# Patient Record
Sex: Male | Born: 1990
Health system: Southern US, Community
[De-identification: ages and names within clinical notes are randomized; demographics above are authoritative.]

## PROBLEM LIST (undated history)

## (undated) DIAGNOSIS — Z789 Other specified health status: Secondary | ICD-10-CM

## (undated) HISTORY — PX: FRACTURE SURGERY: SHX138

## (undated) HISTORY — PX: WISDOM TOOTH EXTRACTION: SHX21

---
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
## 2015-02-23 ENCOUNTER — Encounter: Payer: Self-pay | Admitting: Podiatry

## 2015-02-23 ENCOUNTER — Ambulatory Visit (INDEPENDENT_AMBULATORY_CARE_PROVIDER_SITE_OTHER): Payer: BLUE CROSS/BLUE SHIELD | Admitting: Podiatry

## 2015-02-23 VITALS — BP 122/83 | HR 89 | Resp 18

## 2015-02-23 DIAGNOSIS — B351 Tinea unguium: Secondary | ICD-10-CM

## 2015-02-23 DIAGNOSIS — L600 Ingrowing nail: Secondary | ICD-10-CM
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
## 2015-02-23 NOTE — Progress Notes (Signed)
Subjective:     Patient ID: Dylan Rose, male   DOB: 11/01/1990, 24 y.o.   MRN: [id number]  HPI patient presents with a damaged right hallux nail and is concerned about whether it should be removed and whether there is any issues associated with   Review of Systems  All other systems reviewed and are negative.      Objective:   Physical Exam  Constitutional: Dylan Rose is oriented to person, place, and time.  Cardiovascular: Intact distal pulses.   Musculoskeletal: Normal range of motion.  Neurological: Dylan Rose is oriented to person, place, and time.  Skin: Skin is warm.  Nursing note and vitals reviewed.  neurovascular status intact muscle strength adequate with range of motion within normal limits. Patient's right hallux nail is dystrophic and dark in color but it is localized and slightly loose at the end. States it's been this way for several years and has had a history of trauma     Assessment:     Damage right hallux nail that is essentially dead and is no longer growing properly    Plan:     H&P reviewed and condition explained to patient. At this time we will hold off on permanent removal but at one point in future we'll probably be necessary and I educated him on this event

## 2017-06-03 ENCOUNTER — Ambulatory Visit (INDEPENDENT_AMBULATORY_CARE_PROVIDER_SITE_OTHER): Payer: 59 | Admitting: Family Medicine

## 2017-06-03 ENCOUNTER — Encounter: Payer: Self-pay | Admitting: Family Medicine

## 2017-06-03 VITALS — BP 132/84 | HR 73 | Temp 98.2°F | Resp 18 | Ht 75.0 in | Wt 393.4 lb

## 2017-06-03 DIAGNOSIS — H60391 Other infective otitis externa, right ear: Secondary | ICD-10-CM

## 2017-06-03 DIAGNOSIS — J3089 Other allergic rhinitis: Secondary | ICD-10-CM

## 2017-06-03 MED ORDER — FLUTICASONE PROPIONATE 50 MCG/ACT NA SUSP: 2.0000 | Freq: Every day | NASAL | 6 refills | Status: DC

## 2017-06-03 MED ORDER — CETIRIZINE HCL 10 MG PO TABS: 10.0000 mg | ORAL_TABLET | Freq: Every day | ORAL | 11 refills | Status: DC

## 2017-06-03 NOTE — Progress Notes (Signed)
  Chief Complaint  Patient presents with  . Ear Pain    right ear x 2 weeks , saw an NP dx with infection and given amox for 10 days- on last day today, intermittent pain still    HPI  This is a new patient to our practice with no PMH present today after seeing a NP and being treated for otitis externa right ear with augmentin. He is on day 9/10 and denies any fevers or chills He has some nasal congestion and pressure in the ear [place] [facility] [place] and wanted to ensure he was doing well enough to travel.  He denies sick contacts or asthma. He does not want to the flu vaccine.   History reviewed. No pertinent past medical history.  Current Outpatient Prescriptions  Medication Sig Dispense Refill  . cetirizine (ZYRTEC) 10 MG tablet Take 1 tablet (10 mg total) by mouth daily. 30 tablet 11  . fluticasone (FLONASE) 50 MCG/ACT nasal spray Place 2 sprays into both nostrils daily. 16 g 6   No current facility-administered medications for this visit.     Allergies: Not on File  Past Surgical History:  Procedure Laterality Date  . FRACTURE SURGERY      Social History   Social History  . Marital status: Married    Spouse name: N/A  . Number of children: N/A  . Years of education: N/A   Social History Main Topics  . Smoking status: Never Smoker  . Smokeless tobacco: Never Used  . Alcohol use No  . Drug use: No  . Sexual activity: Not Asked   Other Topics Concern  . None   Social History Narrative  . None    ROS Review of Systems See HPI Constitution: No fevers or chills No malaise No diaphoresis Skin: No rash or itching Eyes: no blurry vision, no double vision GU: no dysuria or hematuria Neuro: no dizziness or headaches  Objective: Vitals:   06/03/17 0838  BP: 132/84  Pulse: 73  Resp: 18  Temp: 98.2 F (36.8 C)  TempSrc: Oral  SpO2: 99%  Weight: (!) 393 lb 6.4 oz (178.4 kg)  Height: 6\' 3"  (1.905 m)    Physical Exam General: alert,  oriented, in NAD Head: normocephalic, atraumatic, no sinus tenderness Eyes: EOM intact, no scleral icterus or conjunctival injection Ears: TM clear bilaterally, bulging with fluid Nose: mucosa nonerythematous, nonedematous Throat: no pharyngeal exudate or erythema Lymph: no posterior auricular, submental or cervical lymph adenopathy Heart: normal rate, normal sinus rhythm, no murmurs Lungs: clear to auscultation bilaterally, no wheezing   Assessment and Plan Leonette MostCharles was seen today for ear pain.  Diagnoses and all orders for this visit:  Infective otitis externa of right ear- supportive care advised  Non-seasonal allergic rhinitis due to fungal spores -     fluticasone (FLONASE) 50 MCG/ACT nasal spray; Place 2 sprays into both nostrils daily. -     cetirizine (ZYRTEC) 10 MG tablet; Take 1 tablet (10 mg total) by mouth daily.     Ulyana Pitones A Jayson Waterhouse

## 2017-06-03 NOTE — Patient Instructions (Addendum)
   IF you received an x-ray today, you will receive an invoice from [facility]. Please contact [facility] at [phone] with questions or concerns regarding your invoice.   IF you received labwork today, you will receive an invoice from [facility]. Please contact [facility] at [phone] with questions or concerns regarding your invoice.   Our billing staff will not be able to assist you with questions regarding bills from these companies.  You will be contacted with the lab results as soon as they are available. The fastest way to get your results is to activate your My Chart account. Instructions are located on the last page of this paperwork. If you have not heard from us regarding the results in 2 weeks, please contact this office.     Allergic Rhinitis Allergic rhinitis is when the mucous membranes in the nose respond to allergens. Allergens are particles in the air that cause your body to have an allergic reaction. This causes you to release allergic antibodies. Through a chain of events, these eventually cause you to release histamine into the blood stream. Although meant to protect the body, it is this release of histamine that causes your discomfort, such as frequent sneezing, congestion, and an itchy, runny nose. What are the causes? Seasonal allergic rhinitis (hay fever) is caused by pollen allergens that may come from grasses, trees, and weeds. Year-round allergic rhinitis (perennial allergic rhinitis) is caused by allergens such as house dust mites, pet dander, and mold spores. What are the signs or symptoms?  Nasal stuffiness (congestion).  Itchy, runny nose with sneezing and tearing of the eyes. How is this diagnosed? Your health care provider can help you determine the allergen or allergens that trigger your symptoms. If you and your health care provider are unable to determine the allergen, skin or blood testing may be used. Your health care provider will  diagnose your condition after taking your health history and performing a physical exam. Your health care provider may assess you for other related conditions, such as asthma, pink eye, or an ear infection. How is this treated? Allergic rhinitis does not have a cure, but it can be controlled by:  Medicines that block allergy symptoms. These may include allergy shots, nasal sprays, and oral antihistamines.  Avoiding the allergen. Hay fever may often be treated with antihistamines in pill or nasal spray forms. Antihistamines block the effects of histamine. There are over-the-counter medicines that may help with nasal congestion and swelling around the eyes. Check with your health care provider before taking or giving this medicine. If avoiding the allergen or the medicine prescribed do not work, there are many new medicines your health care provider can prescribe. Stronger medicine may be used if initial measures are ineffective. Desensitizing injections can be used if medicine and avoidance does not work. Desensitization is when a patient is given ongoing shots until the body becomes less sensitive to the allergen. Make sure you follow up with your health care provider if problems continue. Follow these instructions at home: It is not possible to completely avoid allergens, but you can reduce your symptoms by taking steps to limit your exposure to them. It helps to know exactly what you are allergic to so that you can avoid your specific triggers. Contact a health care provider if:  You have a fever.  You develop a cough that does not stop easily (persistent).  You have shortness of breath.  You start wheezing.  Symptoms interfere with normal daily activities.   This information is not intended to replace advice given to you by your health care provider. Make sure you discuss any questions you have with your health care provider. Document Released: 06/03/2001 Document Revised: 05/09/2016 Document  Reviewed: 05/16/2013 Elsevier Interactive Patient Education  2017 Elsevier Inc.  

## 2017-06-24 ENCOUNTER — Ambulatory Visit (INDEPENDENT_AMBULATORY_CARE_PROVIDER_SITE_OTHER): Payer: 59

## 2017-06-24 ENCOUNTER — Encounter (HOSPITAL_COMMUNITY): Payer: Self-pay

## 2017-06-24 ENCOUNTER — Encounter (HOSPITAL_COMMUNITY): Payer: Self-pay | Admitting: Emergency Medicine

## 2017-06-24 ENCOUNTER — Emergency Department (HOSPITAL_COMMUNITY)
Admission: EM | Admit: 2017-06-24 | Discharge: 2017-06-24 | Disposition: A | Discharge status: Home / Self Care | Payer: 59 | Attending: Emergency Medicine | Admitting: Emergency Medicine

## 2017-06-24 ENCOUNTER — Ambulatory Visit (HOSPITAL_COMMUNITY)
Admit: 2017-06-24 | Discharge: 2017-06-24 | Disposition: A | Discharge status: Home / Self Care | Payer: 59 | Attending: Family Medicine | Admitting: Family Medicine

## 2017-06-24 ENCOUNTER — Emergency Department (HOSPITAL_COMMUNITY): Payer: 59

## 2017-06-24 ENCOUNTER — Ambulatory Visit (HOSPITAL_COMMUNITY)
Admission: EM | Admit: 2017-06-24 | Discharge: 2017-06-24 | Disposition: A | Discharge status: Home / Self Care | Payer: 59 | Attending: Family Medicine | Admitting: Family Medicine

## 2017-06-24 DIAGNOSIS — S4992XA Unspecified injury of left shoulder and upper arm, initial encounter: Secondary | ICD-10-CM

## 2017-06-24 DIAGNOSIS — Z01818 Encounter for other preprocedural examination: Secondary | ICD-10-CM | POA: Diagnosis not present

## 2017-06-24 DIAGNOSIS — S42292A Other displaced fracture of upper end of left humerus, initial encounter for closed fracture: Secondary | ICD-10-CM | POA: Diagnosis not present

## 2017-06-24 DIAGNOSIS — Y999 Unspecified external cause status: Secondary | ICD-10-CM | POA: Insufficient documentation

## 2017-06-24 DIAGNOSIS — Z79899 Other long term (current) drug therapy: Secondary | ICD-10-CM | POA: Diagnosis not present

## 2017-06-24 DIAGNOSIS — M79602 Pain in left arm: Secondary | ICD-10-CM

## 2017-06-24 DIAGNOSIS — M25512 Pain in left shoulder: Secondary | ICD-10-CM

## 2017-06-24 DIAGNOSIS — Y929 Unspecified place or not applicable: Secondary | ICD-10-CM | POA: Diagnosis not present

## 2017-06-24 DIAGNOSIS — M79622 Pain in left upper arm: Secondary | ICD-10-CM

## 2017-06-24 DIAGNOSIS — S42295A Other nondisplaced fracture of upper end of left humerus, initial encounter for closed fracture: Secondary | ICD-10-CM | POA: Insufficient documentation

## 2017-06-24 DIAGNOSIS — M19012 Primary osteoarthritis, left shoulder: Secondary | ICD-10-CM | POA: Diagnosis not present

## 2017-06-24 DIAGNOSIS — Y9389 Activity, other specified: Secondary | ICD-10-CM | POA: Insufficient documentation

## 2017-06-24 MED ORDER — METHOCARBAMOL 500 MG PO TABS: 500.0000 mg | ORAL_TABLET | Freq: Three times a day (TID) | ORAL | 0 refills | Status: DC | PRN

## 2017-06-24 MED ORDER — MORPHINE SULFATE (PF) 4 MG/ML IV SOLN
INTRAVENOUS | Status: AC
  Filled 2017-06-24: qty 1

## 2017-06-24 MED ORDER — HYDROMORPHONE HCL 1 MG/ML IJ SOLN
INTRAMUSCULAR | Status: AC
  Filled 2017-06-24: qty 2

## 2017-06-24 MED ORDER — OXYCODONE-ACETAMINOPHEN 5-325 MG PO TABS
1.0000 | ORAL_TABLET | Freq: Once | ORAL | Status: AC
  Administered 2017-06-24: 1 via ORAL
  Filled 2017-06-24: qty 1

## 2017-06-24 MED ORDER — HYDROMORPHONE HCL 1 MG/ML IJ SOLN
2.0000 mg | Freq: Once | INTRAMUSCULAR | Status: AC
  Administered 2017-06-24: 2 mg via INTRAMUSCULAR
  Filled 2017-06-24: qty 2

## 2017-06-24 MED ORDER — NAPROXEN 500 MG PO TABS: 500.0000 mg | ORAL_TABLET | Freq: Two times a day (BID) | ORAL | 0 refills | Status: DC | PRN

## 2017-06-24 MED ORDER — MORPHINE SULFATE (PF) 2 MG/ML IV SOLN
4.0000 mg | Freq: Once | INTRAVENOUS | Status: AC
  Administered 2017-06-24: 4 mg via INTRAMUSCULAR

## 2017-06-24 MED ORDER — ONDANSETRON 4 MG PO TBDP
ORAL_TABLET | ORAL | Status: AC
  Filled 2017-06-24: qty 1

## 2017-06-24 MED ORDER — METHOCARBAMOL 500 MG PO TABS
500.0000 mg | ORAL_TABLET | Freq: Once | ORAL | Status: AC
  Administered 2017-06-24: 500 mg via ORAL
  Filled 2017-06-24: qty 1

## 2017-06-24 MED ORDER — HYDROMORPHONE HCL 1 MG/ML IJ SOLN
INTRAMUSCULAR | Status: AC
  Filled 2017-06-24: qty 1

## 2017-06-24 MED ORDER — IBUPROFEN 800 MG PO TABS
ORAL_TABLET | ORAL | Status: AC
  Filled 2017-06-24: qty 1

## 2017-06-24 MED ORDER — KETOROLAC TROMETHAMINE 30 MG/ML IJ SOLN
30.0000 mg | Freq: Once | INTRAMUSCULAR | Status: AC
  Administered 2017-06-24: 30 mg via INTRAMUSCULAR
  Filled 2017-06-24: qty 1

## 2017-06-24 MED ORDER — IBUPROFEN 800 MG PO TABS
800.0000 mg | ORAL_TABLET | Freq: Once | ORAL | Status: AC
  Administered 2017-06-24: 800 mg via ORAL

## 2017-06-24 MED ORDER — HYDROMORPHONE HCL 1 MG/ML IJ SOLN
1.0000 mg | Freq: Once | INTRAMUSCULAR | Status: AC
  Administered 2017-06-24: 1 mg via INTRAMUSCULAR

## 2017-06-24 MED ORDER — ONDANSETRON 4 MG PO TBDP
4.0000 mg | ORAL_TABLET | Freq: Once | ORAL | Status: AC
  Administered 2017-06-24: 4 mg via ORAL

## 2017-06-24 MED ORDER — HYDROMORPHONE HCL 1 MG/ML IJ SOLN
2.0000 mg | Freq: Once | INTRAMUSCULAR | Status: AC
  Administered 2017-06-24: 2 mg via INTRAMUSCULAR

## 2017-06-24 MED ORDER — OXYCODONE-ACETAMINOPHEN 10-325 MG PO TABS: ORAL_TABLET | ORAL | 0 refills | Status: DC
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
## 2017-06-24 NOTE — Discharge Instructions (Signed)
Wear splint and shoulder sling at all times until you see the orthopedist.  Ice and elevate arm/shoulder throughout the day, using ice pack for no more than 20 minutes every hour.  Alternate between naprosyn and percocet for pain relief, and use robaxin as directed as needed for muscle spasms. Do not drive or operate machinery with pain medication or muscle relaxant use. Follow up with the orthopedist in the next 3-5 days for recheck of symptoms and ongoing management of your arm fracture. Return to the ER for changes or worsening symptoms.

## 2017-06-24 NOTE — ED Notes (Signed)
Provider wants the x-rays performed 15-20 minutes after patient has received medication

## 2017-06-24 NOTE — ED Notes (Signed)
ED Provider at bedside. 

## 2017-06-24 NOTE — ED Notes (Signed)
PT ao x 4.  RR 18.  No respiratory distress noted.
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
## 2017-06-24 NOTE — Progress Notes (Signed)
Orthopedic Tech Progress Note Patient Details:  Dylan Rose 11-Sep-1991 [id number]  Ortho Devices Type of Ortho Device: Ace wrap, Coapt Ortho Device/Splint Location: LUE Ortho Device/Splint Interventions: Ordered, Application   Jennye Moccasin 06/24/2017, 6:42 PM
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
## 2017-06-24 NOTE — ED Notes (Signed)
Pt transported to CT ?

## 2017-06-26 NOTE — Pre-Procedure Instructions (Signed)
Dylan Rose  06/26/2017      [id number] - [place], [place] - [address] AT CORNER OF [facility] [address] [place] [address] Phone: [phone] Fax: [phone]    Your procedure is scheduled on June 30, 2017.  Report to [facility] Admitting at 1030 AM.  Call this number if you have problems the morning of surgery:  [phone]   Remember:  Do not eat food or drink liquids after midnight.  Take these medicines the morning of surgery with A SIP OF WATER cetirizine (zyrtec), diazepam (valium)-if needed, benadryl-if needed, fluticasone (flonase)-if needed, hydromorphone (dilaudid)-if needed for pain, oxycodone-acetaminophen (percocet)-if needed for pain, methocarbamol (robaxin)-if needed for muscle spasms.     7 days prior to surgery STOP taking any Aspirin (unless otherwise instructed by your surgeon), Aleve, Naproxen, Ibuprofen, Motrin, Advil, Goody's, BC's, all herbal medications, fish oil, and all vitamins  Continue all other medications as instructed by your physician except follow the above medication instructions before surgery   Do not wear jewelry, make-up or nail polish.  Do not wear lotions, powders, or perfumes, or deoderant.  Men may shave face and neck.  Do not bring valuables to the hospital.  [facility] is not responsible for any belongings or valuables.  Contacts, dentures or bridgework may not be worn into surgery.  Leave your suitcase in the car.  After surgery it may be brought to your room.  For patients admitted to the hospital, discharge time will be determined by your treatment team.  Patients discharged the day of surgery will not be allowed to drive home.   Special instructions:   [place]- Preparing For Surgery  Before surgery, you can play an important role. Because skin is not sterile, your skin needs to be as free of germs as possible. You can reduce the number of germs on  your skin by washing with CHG (chlorahexidine gluconate) Soap before surgery.  CHG is an antiseptic cleaner which kills germs and bonds with the skin to continue killing germs even after washing.  Please do not use if you have an allergy to CHG or antibacterial soaps. If your skin becomes reddened/irritated stop using the CHG.  Do not shave (including legs and underarms) for at least 48 hours prior to first CHG shower. It is OK to shave your face.  Please follow these instructions carefully.   1. Shower the NIGHT BEFORE SURGERY and the MORNING OF SURGERY with CHG.   2. If you chose to wash your hair, wash your hair first as usual with your normal shampoo.  3. After you shampoo, rinse your hair and body thoroughly to remove the shampoo.  4. Use CHG as you would any other liquid soap. You can apply CHG directly to the skin and wash gently with a scrungie or a clean washcloth.   5. Apply the CHG Soap to your body ONLY FROM THE NECK DOWN.  Do not use on open wounds or open sores. Avoid contact with your eyes, ears, mouth and genitals (private parts). Wash genitals (private parts) with your normal soap.  USE REGULAR SHAMPOO AND CONDITIONER FOR HAIR USE REGULAR SOAP FOR FACE AND PRIVATE AREA  6. Wash thoroughly, paying special attention to the area where your surgery will be performed.  7. Thoroughly rinse your body with warm water from the neck down.  8. DO NOT shower/wash with your normal soap after using and rinsing off the CHG Soap.  9. Pat yourself dry with a CLEAN TOWEL and WASH CLOTH  10. Wear CLEAN PAJAMAS to bed the night before surgery, wear comfortable clothes the morning of surgery  11. Place CLEAN SHEETS on your bed the night of your first shower and DO NOT SLEEP WITH PETS.    Day of Surgery: Do not apply any deodorants/lotions. Please wear clean clothes to the hospital/surgery center.    Please read over the following fact sheets that you were given. Pain Booklet,  Coughing and Deep Breathing and Surgical Site Infection Prevention
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
## 2017-06-29 ENCOUNTER — Encounter (HOSPITAL_COMMUNITY)
Admission: RE | Admit: 2017-06-29 | Discharge: 2017-06-29 | Disposition: A | Discharge status: Home / Self Care | Payer: 59 | Source: Ambulatory Visit | Attending: Orthopedic Surgery | Admitting: Orthopedic Surgery

## 2017-06-29 ENCOUNTER — Encounter (HOSPITAL_COMMUNITY): Payer: Self-pay

## 2017-06-29 DIAGNOSIS — Y92488 Other paved roadways as the place of occurrence of the external cause: Secondary | ICD-10-CM | POA: Diagnosis not present

## 2017-06-29 DIAGNOSIS — Z79891 Long term (current) use of opiate analgesic: Secondary | ICD-10-CM | POA: Diagnosis not present

## 2017-06-29 DIAGNOSIS — Z79899 Other long term (current) drug therapy: Secondary | ICD-10-CM | POA: Diagnosis not present

## 2017-06-29 DIAGNOSIS — S42202A Unspecified fracture of upper end of left humerus, initial encounter for closed fracture: Secondary | ICD-10-CM | POA: Diagnosis present

## 2017-06-29 HISTORY — DX: Other specified health status: Z78.9

## 2017-06-29 LAB — CBC
HEMATOCRIT: 40.7 % (ref 39.0–52.0)
HEMOGLOBIN: 15 g/dL (ref 13.0–17.0)
MCH: 30.5 pg (ref 26.0–34.0)
MCHC: 36.9 g/dL — ABNORMAL HIGH (ref 30.0–36.0)
MCV: 82.9 fL (ref 78.0–100.0)
Platelets: 298 10*3/uL (ref 150–400)
RBC: 4.91 MIL/uL (ref 4.22–5.81)
RDW: 12.4 % (ref 11.5–15.5)
WBC: 8.1 10*3/uL (ref 4.0–10.5)

## 2017-06-29 LAB — BASIC METABOLIC PANEL
ANION GAP: 12 (ref 5–15)
BUN: 9 mg/dL (ref 6–20)
CHLORIDE: 98 mmol/L — AB (ref 101–111)
CO2: 24 mmol/L (ref 22–32)
Calcium: 8.7 mg/dL — ABNORMAL LOW (ref 8.9–10.3)
Creatinine, Ser: 0.73 mg/dL (ref 0.61–1.24)
GFR calc Af Amer: >60 mL/min (ref >60)
GLUCOSE: 114 mg/dL — AB (ref 65–99)
POTASSIUM: 4.2 mmol/L (ref 3.5–5.1)
Sodium: 134 mmol/L — ABNORMAL LOW (ref 135–145)

## 2017-06-29 MED ORDER — DEXTROSE 5 % IV SOLN
3.0000 g | INTRAVENOUS | Status: AC
  Administered 2017-06-30: 3 g via INTRAVENOUS
  Filled 2017-06-29: qty 3000

## 2017-06-30 ENCOUNTER — Encounter (HOSPITAL_COMMUNITY): Payer: Self-pay

## 2017-06-30 ENCOUNTER — Ambulatory Visit (HOSPITAL_COMMUNITY)
Admission: RE | Admit: 2017-06-30 | Discharge: 2017-07-01 | Disposition: A | Discharge status: Home / Self Care | Payer: 59 | Source: Ambulatory Visit | Attending: Orthopedic Surgery | Admitting: Orthopedic Surgery

## 2017-06-30 ENCOUNTER — Ambulatory Visit (HOSPITAL_COMMUNITY): Payer: 59 | Admitting: Anesthesiology

## 2017-06-30 ENCOUNTER — Encounter (HOSPITAL_COMMUNITY)
Admission: RE | Disposition: A | Discharge status: Home / Self Care | Payer: Self-pay | Source: Ambulatory Visit | Attending: Orthopedic Surgery

## 2017-06-30 ENCOUNTER — Ambulatory Visit (HOSPITAL_COMMUNITY): Payer: 59

## 2017-06-30 DIAGNOSIS — Z79899 Other long term (current) drug therapy: Secondary | ICD-10-CM | POA: Insufficient documentation

## 2017-06-30 DIAGNOSIS — S42292A Other displaced fracture of upper end of left humerus, initial encounter for closed fracture: Secondary | ICD-10-CM | POA: Diagnosis present

## 2017-06-30 DIAGNOSIS — Z419 Encounter for procedure for purposes other than remedying health state, unspecified: Secondary | ICD-10-CM

## 2017-06-30 DIAGNOSIS — S42202A Unspecified fracture of upper end of left humerus, initial encounter for closed fracture: Secondary | ICD-10-CM | POA: Diagnosis present

## 2017-06-30 DIAGNOSIS — Z79891 Long term (current) use of opiate analgesic: Secondary | ICD-10-CM | POA: Insufficient documentation

## 2017-06-30 DIAGNOSIS — Y92488 Other paved roadways as the place of occurrence of the external cause: Secondary | ICD-10-CM | POA: Insufficient documentation

## 2017-06-30 HISTORY — PX: ORIF HUMERUS FRACTURE: SHX2126

## 2017-06-30 SURGERY — OPEN REDUCTION INTERNAL FIXATION (ORIF) PROXIMAL HUMERUS FRACTURE
Anesthesia: General | Site: Arm Upper | Laterality: Left

## 2017-06-30 MED ORDER — FENTANYL CITRATE (PF) 100 MCG/2ML IJ SOLN
INTRAMUSCULAR | Status: AC
  Administered 2017-06-30: 100 ug via INTRAVENOUS
  Filled 2017-06-30: qty 2

## 2017-06-30 MED ORDER — SUCCINYLCHOLINE CHLORIDE 200 MG/10ML IV SOSY
PREFILLED_SYRINGE | INTRAVENOUS | Status: AC
  Filled 2017-06-30: qty 10

## 2017-06-30 MED ORDER — LIDOCAINE 2% (20 MG/ML) 5 ML SYRINGE
INTRAMUSCULAR | Status: AC
  Filled 2017-06-30: qty 5

## 2017-06-30 MED ORDER — PROPOFOL 10 MG/ML IV BOLUS
INTRAVENOUS | Status: AC
  Filled 2017-06-30: qty 20

## 2017-06-30 MED ORDER — LACTATED RINGERS IV SOLN
INTRAVENOUS | Status: DC
  Administered 2017-06-30: 13:00:00 via INTRAVENOUS

## 2017-06-30 MED ORDER — CHLORHEXIDINE GLUCONATE 4 % EX LIQD: 60.0000 mL | Freq: Once | CUTANEOUS | Status: DC

## 2017-06-30 MED ORDER — HYDROMORPHONE HCL 2 MG PO TABS
2.0000 mg | ORAL_TABLET | ORAL | Status: DC | PRN
  Administered 2017-07-01 (×2): 4 mg via ORAL
  Filled 2017-06-30 (×2): qty 2

## 2017-06-30 MED ORDER — FLUTICASONE PROPIONATE 50 MCG/ACT NA SUSP
1.0000 | Freq: Every day | NASAL | Status: DC | PRN
  Filled 2017-06-30: qty 16

## 2017-06-30 MED ORDER — PROPOFOL 10 MG/ML IV BOLUS
INTRAVENOUS | Status: DC | PRN
Start: 2017-06-30 — End: 2017-06-30
  Administered 2017-06-30: 300 mg via INTRAVENOUS

## 2017-06-30 MED ORDER — MEPERIDINE HCL 25 MG/ML IJ SOLN: 6.2500 mg | INTRAMUSCULAR | Status: DC | PRN

## 2017-06-30 MED ORDER — DIAZEPAM 5 MG PO TABS
10.0000 mg | ORAL_TABLET | Freq: Three times a day (TID) | ORAL | Status: DC | PRN
  Administered 2017-07-01 (×2): 10 mg via ORAL
  Filled 2017-06-30 (×2): qty 2

## 2017-06-30 MED ORDER — ACETAMINOPHEN 650 MG RE SUPP: 650.0000 mg | Freq: Four times a day (QID) | RECTAL | Status: DC | PRN

## 2017-06-30 MED ORDER — DEXMEDETOMIDINE HCL 200 MCG/2ML IV SOLN
INTRAVENOUS | Status: DC | PRN
  Administered 2017-06-30 (×6): 12 ug via INTRAVENOUS

## 2017-06-30 MED ORDER — ONDANSETRON HCL 4 MG PO TABS: 4.0000 mg | ORAL_TABLET | Freq: Four times a day (QID) | ORAL | Status: DC | PRN

## 2017-06-30 MED ORDER — FENTANYL CITRATE (PF) 100 MCG/2ML IJ SOLN: 25.0000 ug | INTRAMUSCULAR | Status: DC | PRN

## 2017-06-30 MED ORDER — FENTANYL CITRATE (PF) 100 MCG/2ML IJ SOLN
100.0000 ug | Freq: Once | INTRAMUSCULAR | Status: AC
  Administered 2017-06-30: 100 ug via INTRAVENOUS
  Filled 2017-06-30: qty 2

## 2017-06-30 MED ORDER — ROPIVACAINE HCL 7.5 MG/ML IJ SOLN
INTRAMUSCULAR | Status: DC | PRN
  Administered 2017-06-30: 30 mL via PERINEURAL

## 2017-06-30 MED ORDER — FENTANYL CITRATE (PF) 100 MCG/2ML IJ SOLN
100.0000 ug | Freq: Once | INTRAMUSCULAR | Status: AC
  Administered 2017-06-30: 100 ug via INTRAVENOUS

## 2017-06-30 MED ORDER — MIDAZOLAM HCL 2 MG/2ML IJ SOLN
2.0000 mg | Freq: Once | INTRAMUSCULAR | Status: AC
Start: 2017-06-30 — End: 2017-06-30
  Administered 2017-06-30: 2 mg via INTRAVENOUS

## 2017-06-30 MED ORDER — SUCCINYLCHOLINE CHLORIDE 20 MG/ML IJ SOLN
INTRAMUSCULAR | Status: DC | PRN
  Administered 2017-06-30: 200 mg via INTRAVENOUS

## 2017-06-30 MED ORDER — DEXAMETHASONE SODIUM PHOSPHATE 10 MG/ML IJ SOLN
INTRAMUSCULAR | Status: DC | PRN
  Administered 2017-06-30: 10 mg via INTRAVENOUS

## 2017-06-30 MED ORDER — ACETAMINOPHEN 325 MG PO TABS: 650.0000 mg | ORAL_TABLET | Freq: Four times a day (QID) | ORAL | Status: DC | PRN

## 2017-06-30 MED ORDER — HYDROMORPHONE HCL 1 MG/ML IJ SOLN
1.0000 mg | INTRAMUSCULAR | Status: DC | PRN
  Administered 2017-07-01 (×3): 1 mg via INTRAVENOUS
  Filled 2017-06-30 (×3): qty 1

## 2017-06-30 MED ORDER — LIDOCAINE HCL (CARDIAC) 20 MG/ML IV SOLN
INTRAVENOUS | Status: DC | PRN
  Administered 2017-06-30: 100 mg via INTRAVENOUS

## 2017-06-30 MED ORDER — MIDAZOLAM HCL 2 MG/2ML IJ SOLN
INTRAMUSCULAR | Status: AC
  Administered 2017-06-30: 2 mg via INTRAVENOUS
  Filled 2017-06-30: qty 2

## 2017-06-30 MED ORDER — ONDANSETRON HCL 4 MG/2ML IJ SOLN: 4.0000 mg | Freq: Four times a day (QID) | INTRAMUSCULAR | Status: DC | PRN

## 2017-06-30 MED ORDER — ONDANSETRON HCL 4 MG/2ML IJ SOLN
INTRAMUSCULAR | Status: AC
  Filled 2017-06-30: qty 2

## 2017-06-30 MED ORDER — ROCURONIUM BROMIDE 100 MG/10ML IV SOLN
INTRAVENOUS | Status: DC | PRN
  Administered 2017-06-30: 50 mg via INTRAVENOUS

## 2017-06-30 MED ORDER — DIPHENHYDRAMINE HCL 25 MG PO CAPS: 25.0000 mg | ORAL_CAPSULE | Freq: Every day | ORAL | Status: DC | PRN

## 2017-06-30 MED ORDER — MIDAZOLAM HCL 2 MG/2ML IJ SOLN
2.0000 mg | Freq: Once | INTRAMUSCULAR | Status: AC
  Administered 2017-06-30: 2 mg via INTRAVENOUS
  Filled 2017-06-30: qty 2

## 2017-06-30 MED ORDER — LORATADINE 10 MG PO TABS
10.0000 mg | ORAL_TABLET | Freq: Every day | ORAL | Status: DC
  Administered 2017-06-30: 10 mg via ORAL
  Filled 2017-06-30: qty 1

## 2017-06-30 MED ORDER — PHENYLEPHRINE HCL 10 MG/ML IJ SOLN
INTRAMUSCULAR | Status: DC | PRN
  Administered 2017-06-30 (×2): 80 ug via INTRAVENOUS
  Administered 2017-06-30: 120 ug via INTRAVENOUS

## 2017-06-30 MED ORDER — SUGAMMADEX SODIUM 500 MG/5ML IV SOLN
INTRAVENOUS | Status: DC | PRN
  Administered 2017-06-30: 360 mg via INTRAVENOUS

## 2017-06-30 MED ORDER — ONDANSETRON HCL 4 MG/2ML IJ SOLN
4.0000 mg | Freq: Once | INTRAMUSCULAR | Status: AC | PRN
Start: 2017-06-30 — End: 2017-06-30
  Administered 2017-06-30: 4 mg via INTRAVENOUS

## 2017-06-30 MED ORDER — SODIUM CHLORIDE 0.9 % IR SOLN
Status: DC | PRN
  Administered 2017-06-30: 1000 mL

## 2017-06-30 MED ORDER — FENTANYL CITRATE (PF) 250 MCG/5ML IJ SOLN
INTRAMUSCULAR | Status: AC
  Filled 2017-06-30: qty 5

## 2017-06-30 MED ORDER — FENTANYL CITRATE (PF) 100 MCG/2ML IJ SOLN
INTRAMUSCULAR | Status: DC | PRN
  Administered 2017-06-30: 150 ug via INTRAVENOUS

## 2017-06-30 MED ORDER — ONDANSETRON HCL 4 MG/2ML IJ SOLN
INTRAMUSCULAR | Status: DC | PRN
  Administered 2017-06-30: 4 mg via INTRAVENOUS

## 2017-06-30 MED ORDER — SENNA 8.6 MG PO TABS
1.0000 | ORAL_TABLET | Freq: Two times a day (BID) | ORAL | Status: DC
  Administered 2017-06-30: 8.6 mg via ORAL
  Filled 2017-06-30: qty 1

## 2017-06-30 SURGICAL SUPPLY — 72 items
BIT DRILL 2.4 AO COUPLING CANN (BIT) ×3 IMPLANT
BIT DRILL 3.2 (BIT) ×2
BIT DRILL 3.2XCALB NS DISP (BIT) ×1 IMPLANT
BIT DRILL 5/64X5 DISP (BIT) ×3 IMPLANT
BIT DRILL CALIBRATED 2.7 (BIT) ×2 IMPLANT
BIT DRILL CALIBRATED 2.7MM (BIT) ×1
BIT DRL 3.2XCALB NS DISP (BIT) ×1
CLOSURE WOUND 1/2 X4 (GAUZE/BANDAGES/DRESSINGS) ×1
COVER SURGICAL LIGHT HANDLE (MISCELLANEOUS) ×3 IMPLANT
DRAPE C-ARM 35X43 STRL (DRAPES) ×3 IMPLANT
DRAPE INCISE IOBAN 66X45 STRL (DRAPES) ×3 IMPLANT
DRAPE ORTHO SPLIT 77X108 STRL (DRAPES) ×4
DRAPE SURG ORHT 6 SPLT 77X108 (DRAPES) ×2 IMPLANT
DRAPE U-SHAPE 47X51 STRL (DRAPES) ×3 IMPLANT
DRSG ADAPTIC 3X8 NADH LF (GAUZE/BANDAGES/DRESSINGS) ×3 IMPLANT
DRSG AQUACEL AG ADV 3.5X10 (GAUZE/BANDAGES/DRESSINGS) ×3 IMPLANT
DRSG PAD ABDOMINAL 8X10 ST (GAUZE/BANDAGES/DRESSINGS) ×3 IMPLANT
DURAPREP 26ML APPLICATOR (WOUND CARE) ×3 IMPLANT
ELECT BLADE 4.0 EZ CLEAN MEGAD (MISCELLANEOUS) ×3
ELECT REM PT RETURN 9FT ADLT (ELECTROSURGICAL) ×3
ELECTRODE BLDE 4.0 EZ CLN MEGD (MISCELLANEOUS) ×1 IMPLANT
ELECTRODE REM PT RTRN 9FT ADLT (ELECTROSURGICAL) ×1 IMPLANT
GAUZE SPONGE 4X4 12PLY STRL (GAUZE/BANDAGES/DRESSINGS) ×3 IMPLANT
GLOVE BIO SURGEON STRL SZ7.5 (GLOVE) ×3 IMPLANT
GLOVE BIOGEL PI IND STRL 8 (GLOVE) ×1 IMPLANT
GLOVE BIOGEL PI INDICATOR 8 (GLOVE) ×2
GOWN STRL REUS W/ TWL LRG LVL3 (GOWN DISPOSABLE) ×1 IMPLANT
GOWN STRL REUS W/ TWL XL LVL3 (GOWN DISPOSABLE) ×2 IMPLANT
GOWN STRL REUS W/TWL LRG LVL3 (GOWN DISPOSABLE) ×2
GOWN STRL REUS W/TWL XL LVL3 (GOWN DISPOSABLE) ×4
K-WIRE 2X5 SS THRDED S3 (WIRE) ×6
K-WIRE TROC 1.25X150 (WIRE) ×3
KIT BASIN OR (CUSTOM PROCEDURE TRAY) ×3 IMPLANT
KIT BEACH CHAIR TRIMANO (MISCELLANEOUS) IMPLANT
KIT ROOM TURNOVER OR (KITS) ×3 IMPLANT
KWIRE 2X5 SS THRDED S3 (WIRE) ×2 IMPLANT
KWIRE TROC 1.25X150 (WIRE) ×1 IMPLANT
MANIFOLD NEPTUNE II (INSTRUMENTS) ×3 IMPLANT
NEEDLE 1/2 CIR MAYO (NEEDLE) ×3 IMPLANT
NEEDLE HYPO 25GX1X1/2 BEV (NEEDLE) ×3 IMPLANT
NS IRRIG 1000ML POUR BTL (IV SOLUTION) ×3 IMPLANT
PACK SHOULDER (CUSTOM PROCEDURE TRAY) ×3 IMPLANT
PAD ARMBOARD 7.5X6 YLW CONV (MISCELLANEOUS) ×6 IMPLANT
PEG LOCKING 3.2X 22MM (Peg) ×3 IMPLANT
PEG LOCKING 3.2X32 (Peg) ×3 IMPLANT
PEG LOCKING 3.2X34 (Screw) ×6 IMPLANT
PLATE PROX HUMERUS HI LT 4H (Plate) ×3 IMPLANT
SCREW CANN 4.0X50 (Screw) ×2 IMPLANT
SCREW CANN PT 50X4 NS SM (Screw) ×1 IMPLANT
SCREW LOCK CORT STAR 3.5X40 (Screw) ×3 IMPLANT
SCREW LOCK CORT STAR 3.5X50 (Screw) ×6 IMPLANT
SCREW LOCK CORT STAR 3.5X65 (Screw) ×3 IMPLANT
SCREW LOW PROFILE 3.5X30MM TIS (Screw) ×3 IMPLANT
SCREW LP NL T15 3.5X26 (Screw) ×6 IMPLANT
SLEEVE MEASURING 3.2 (BIT) ×3 IMPLANT
SLING ARM FOAM STRAP LRG (SOFTGOODS) IMPLANT
SLING ARM FOAM STRAP MED (SOFTGOODS) IMPLANT
SPONGE LAP 18X18 X RAY DECT (DISPOSABLE) IMPLANT
SPONGE LAP 4X18 X RAY DECT (DISPOSABLE) ×3 IMPLANT
STRIP CLOSURE SKIN 1/2X4 (GAUZE/BANDAGES/DRESSINGS) ×2 IMPLANT
SUCTION FRAZIER HANDLE 10FR (MISCELLANEOUS) ×2
SUCTION TUBE FRAZIER 10FR DISP (MISCELLANEOUS) ×1 IMPLANT
SUT FIBERWIRE #2 38 T-5 BLUE (SUTURE) ×15
SUT MNCRL AB 4-0 PS2 18 (SUTURE) ×3 IMPLANT
SUT VIC AB 2-0 CT1 27 (SUTURE) ×2
SUT VIC AB 2-0 CT1 TAPERPNT 27 (SUTURE) ×1 IMPLANT
SUTURE FIBERWR #2 38 T-5 BLUE (SUTURE) ×5 IMPLANT
SYR CONTROL 10ML LL (SYRINGE) ×3 IMPLANT
TOWEL OR 17X24 6PK STRL BLUE (TOWEL DISPOSABLE) ×3 IMPLANT
TOWEL OR 17X26 10 PK STRL BLUE (TOWEL DISPOSABLE) ×3 IMPLANT
WATER STERILE IRR 1000ML POUR (IV SOLUTION) ×3 IMPLANT
YANKAUER SUCT BULB TIP NO VENT (SUCTIONS) ×3 IMPLANT

## 2017-06-30 NOTE — Transfer of Care (Signed)
Immediate Anesthesia Transfer of Care Note  Patient: Dylan Rose  Procedure(s) Performed: OPEN REDUCTION INTERNAL FIXATION (ORIF) PROXIMAL HUMERUS FRACTURE (Left Arm Upper)  Patient Location: PACU  Anesthesia Type:GA combined with regional for post-op pain  Level of Consciousness: awake and patient cooperative  Airway & Oxygen Therapy: Patient Spontanous Breathing  Post-op Assessment: Report given to RN and Post -op Vital signs reviewed and stable  Post vital signs: Reviewed and stable  Last Vitals:  Vitals:   06/30/17 1015 06/30/17 1554  BP: (!) 148/81   Pulse: 89   Resp: 18   Temp: 36.8 C (P) 36.5 C  SpO2: 100%     Last Pain:  Vitals:   06/30/17 1554  TempSrc:   PainSc: (P) 0-No pain      Patients Stated Pain Goal: 3 (06/30/17 1015)  Complications: No apparent anesthesia complications

## 2017-06-30 NOTE — Anesthesia Procedure Notes (Signed)
Procedure Name: Intubation Date/Time: 06/30/2017 1:11 PM Performed by: Rosiland Oz Pre-anesthesia Checklist: Patient identified, Emergency Drugs available, Suction available, Patient being monitored and Timeout performed Patient Re-evaluated:Patient Re-evaluated prior to induction Oxygen Delivery Method: Circle system utilized Preoxygenation: Pre-oxygenation with 100% oxygen Induction Type: IV induction Ventilation: Mask ventilation without difficulty Laryngoscope Size: Glidescope and 4 Grade View: Grade I Tube type: Oral Tube size: 7.5 mm Number of attempts: 1 Airway Equipment and Method: Stylet Placement Confirmation: positive ETCO2,  ETT inserted through vocal cords under direct vision and breath sounds checked- equal and bilateral Secured at: 23 cm Tube secured with: Tape Dental Injury: Teeth and Oropharynx as per pre-operative assessment

## 2017-06-30 NOTE — Brief Op Note (Signed)
06/30/2017  3:29 PM  PATIENT:  Dylan Rose  25 y.o. male  PRE-OPERATIVE DIAGNOSIS:  Left proximal humerus fracture  POST-OPERATIVE DIAGNOSIS:  Left proximal humerus fracture  PROCEDURE:  Procedure(s) with comments: OPEN REDUCTION INTERNAL FIXATION (ORIF) PROXIMAL HUMERUS FRACTURE (Left) - 180 mins  SURGEON:  Surgeon(s) and Role:    * Aundria Rud, Noah Delaine, MD - Primary  PHYSICIAN ASSISTANT:   ASSISTANTS: Hart Carwin, RNFA   ANESTHESIA:   regional and general  EBL:  Total I/O In: 1000 [I.V.:1000] Out: 100 [Blood:100]  BLOOD ADMINISTERED:none  DRAINS: none   LOCAL MEDICATIONS USED:  NONE  SPECIMEN:  No Specimen  DISPOSITION OF SPECIMEN:  N/A  COUNTS:  YES  TOURNIQUET:  * No tourniquets in log *  DICTATION: .Note written in EPIC  PLAN OF CARE: Admit for overnight observation  PATIENT DISPOSITION:  PACU - hemodynamically stable.   Delay start of Pharmacological VTE agent (>24hrs) due to surgical blood loss or risk of bleeding: not applicable

## 2017-06-30 NOTE — Anesthesia Preprocedure Evaluation (Addendum)
Anesthesia Evaluation  Patient identified by MRN, date of birth, ID band Patient awake    Reviewed: Allergy & Precautions, NPO status , Patient's Chart, lab work & pertinent test results  Airway Mallampati: III  TM Distance: >3 FB Neck ROM: Limited  Mouth opening: Limited Mouth Opening  Dental no notable dental hx.    Pulmonary neg pulmonary ROS,    Pulmonary exam normal breath sounds clear to auscultation       Cardiovascular negative cardio ROS Normal cardiovascular exam Rhythm:Regular Rate:Normal     Neuro/Psych negative neurological ROS  negative psych ROS   GI/Hepatic negative GI ROS, Neg liver ROS,   Endo/Other  negative endocrine ROS  Renal/GU negative Renal ROS  negative genitourinary   Musculoskeletal negative musculoskeletal ROS (+)   Abdominal   Peds negative pediatric ROS (+)  Hematology negative hematology ROS (+)   Anesthesia Other Findings   Reproductive/Obstetrics negative OB ROS                            Anesthesia Physical Anesthesia Plan  ASA: II  Anesthesia Plan: General   Post-op Pain Management: GA combined w/ Regional for post-op pain   Induction:   PONV Risk Score and Plan: 1 and Ondansetron, Dexamethasone and Treatment may vary due to age or medical condition  Airway Management Planned: LMA and Oral ETT  Additional Equipment:   Intra-op Plan:   Post-operative Plan:   Informed Consent:   Plan Discussed with:   Anesthesia Plan Comments:        Anesthesia Quick Evaluation

## 2017-06-30 NOTE — Anesthesia Procedure Notes (Signed)
Anesthesia Regional Block: Supraclavicular block   Pre-Anesthetic Checklist: ,, timeout performed, Correct Patient, Correct Site, Correct Laterality, Correct Procedure, Correct Position, site marked, Risks and benefits discussed,  Surgical consent,  Pre-op evaluation,  At surgeon's request and post-op pain management  Laterality: Left  Prep: chloraprep       Needles:  Injection technique: Single-shot  Needle Type: Echogenic Stimulator Needle     Needle Length: 5cm  Needle Gauge: 22     Additional Needles:   Procedures:, nerve stimulator,,, ultrasound used (permanent image in chart),,,,   Nerve Stimulator or Paresthesia:  Response: deltoid, 0.45 mA,   Additional Responses:   Narrative:  Start time: 06/30/2017 12:10 PM End time: 06/30/2017 12:40 PM Injection made incrementally with aspirations every 5 mL.  Performed by: Personally  Anesthesiologist: Dona Klemann  Additional Notes: Functioning IV was confirmed and monitors were applied.  A 50mm 22ga Arrow echogenic stimulator needle was used. Sterile prep and drape,hand hygiene and sterile gloves were used. Ultrasound guidance: relevant anatomy identified, needle position confirmed, local anesthetic spread visualized around nerve(s)., vascular puncture avoided.  Image printed for medical record. Negative aspiration and negative test dose prior to incremental administration of local anesthetic. The patient tolerated the procedure well.
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
## 2017-06-30 NOTE — Anesthesia Postprocedure Evaluation (Signed)
Anesthesia Post Note  Patient: Geoffery Spruce IV  Procedure(s) Performed: OPEN REDUCTION INTERNAL FIXATION (ORIF) PROXIMAL HUMERUS FRACTURE (Left Arm Upper)     Patient location during evaluation: PACU Anesthesia Type: General Level of consciousness: awake and alert Pain management: pain level controlled Vital Signs Assessment: post-procedure vital signs reviewed and stable Respiratory status: spontaneous breathing, nonlabored ventilation, respiratory function stable and patient connected to nasal cannula oxygen Cardiovascular status: blood pressure returned to baseline and stable Postop Assessment: no apparent nausea or vomiting Anesthetic complications: no    Last Vitals:  Vitals:   06/30/17 1015 06/30/17 1554  BP: (!) 148/81   Pulse: 89   Resp: 18   Temp: 36.8 C (P) 36.5 C  SpO2: 100%     Last Pain:  Vitals:   06/30/17 1554  TempSrc:   PainSc: (P) 0-No pain   Pain Goal: Patients Stated Pain Goal: 3 (06/30/17 1015)               Esaias Cleavenger

## 2017-07-01 ENCOUNTER — Encounter (HOSPITAL_COMMUNITY): Payer: Self-pay | Admitting: Orthopedic Surgery

## 2017-07-01 DIAGNOSIS — S42202A Unspecified fracture of upper end of left humerus, initial encounter for closed fracture: Secondary | ICD-10-CM | POA: Diagnosis not present

## 2017-07-01 LAB — CBC
HCT: 41.3 % (ref 39.0–52.0)
HEMOGLOBIN: 14.2 g/dL (ref 13.0–17.0)
MCH: 28.8 pg (ref 26.0–34.0)
MCHC: 34.4 g/dL (ref 30.0–36.0)
MCV: 83.8 fL (ref 78.0–100.0)
PLATELETS: 353 10*3/uL (ref 150–400)
RBC: 4.93 MIL/uL (ref 4.22–5.81)
RDW: 12 % (ref 11.5–15.5)
WBC: 20.1 10*3/uL — ABNORMAL HIGH (ref 4.0–10.5)

## 2017-07-01 LAB — HIV ANTIBODY (ROUTINE TESTING W REFLEX): HIV Screen 4th Generation wRfx: NONREACTIVE

## 2017-07-01 MED ORDER — DIAZEPAM 10 MG PO TABS: 10.0000 mg | ORAL_TABLET | Freq: Three times a day (TID) | ORAL | 0 refills | Status: DC | PRN

## 2017-07-01 MED ORDER — HYDROMORPHONE HCL 2 MG PO TABS: 2.0000 mg | ORAL_TABLET | ORAL | 0 refills | Status: DC | PRN
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
## 2017-07-01 NOTE — Progress Notes (Signed)
   Subjective:  Patient reports pain as moderate.  Nerve block wore off around 9 pm last night.  Pain well cnotrolled with prn meds.  Denies SOB, CP, N/V.  Objective:   VITALS:   Vitals:   06/30/17 1645 06/30/17 1656 06/30/17 2000 07/01/17 0632  BP:  (!) 168/86 (!) 146/100 (!) 174/110  Pulse:  98 (!) 109 (!) 107  Resp:  Temp: 97.7 F (36.5 C) 98 F (36.7 C) 98 F (36.7 C) 97.9 F (36.6 C)  TempSrc:  Oral Oral Oral  SpO2:  96% 96% 94%  Weight:      Height:       LUE- Neurovascular intact Sensation intact distally Intact pulses distally Incision: dressing C/D/I SILT ax/mc/labc/rad/med/uln Motor intact ax/mc/rad/med/pin/ain/uln 2+ rad pulse  Lab Results  Component Value Date   WBC 8.1 06/29/2017   HGB 15.0 06/29/2017   HCT 40.7 06/29/2017   MCV 82.9 06/29/2017   PLT 298 06/29/2017   BMET    Component Value Date/Time   NA 134 (L) 06/29/2017 0854   K 4.2 06/29/2017 0854   CL 98 (L) 06/29/2017 0854   CO2 24 06/29/2017 0854   GLUCOSE 114 (H) 06/29/2017 0854   BUN 9 06/29/2017 0854   CREATININE 0.73 06/29/2017 0854   CALCIUM 8.7 (L) 06/29/2017 0854   GFRNONAA >60 06/29/2017 0854   GFRAA >60 06/29/2017 0854     Assessment/Plan: 1 Day Post-Op   Active Problems:   Closed fracture of left proximal humerus   Humeral head fracture, left, closed, initial encounter   Advance diet Dc home today Pain is controlled with PRNs SCD for DVT ppx, home on asa daily x 1 month Sling to LUE, NWB   Yolonda Kida 07/01/2017, 7:33 AM   Maryan Rued, MD [phone]

## 2017-07-01 NOTE — Discharge Instructions (Signed)
Ortho instructions:  Maintain left arm in sling Ok to shower with bandage in place, if bandage becomes saturated, change to a daily dry dressing No weight bearing to left arm Take an 81 mg aspirin daily to prevent blood clots. Apply ice to left shoulder 3 times daily for 20-30 minutes.

## 2017-09-30 DIAGNOSIS — S42292G Other displaced fracture of upper end of left humerus, subsequent encounter for fracture with delayed healing: Secondary | ICD-10-CM | POA: Diagnosis not present

## 2017-10-01 DIAGNOSIS — S42201A Unspecified fracture of upper end of right humerus, initial encounter for closed fracture: Secondary | ICD-10-CM | POA: Diagnosis not present

## 2017-10-14 DIAGNOSIS — S42292P Other displaced fracture of upper end of left humerus, subsequent encounter for fracture with malunion: Secondary | ICD-10-CM | POA: Diagnosis not present

## 2017-10-19 DIAGNOSIS — S42202P Unspecified fracture of upper end of left humerus, subsequent encounter for fracture with malunion: Secondary | ICD-10-CM | POA: Diagnosis not present

## 2017-10-20 DIAGNOSIS — S42292P Other displaced fracture of upper end of left humerus, subsequent encounter for fracture with malunion: Secondary | ICD-10-CM | POA: Diagnosis not present

## 2017-10-21 DIAGNOSIS — M19212 Secondary osteoarthritis, left shoulder: Secondary | ICD-10-CM | POA: Diagnosis not present

## 2017-10-29 ENCOUNTER — Encounter: Payer: Self-pay | Admitting: Physical Therapy

## 2017-10-29 ENCOUNTER — Ambulatory Visit: Payer: 59 | Attending: Orthopedic Surgery | Admitting: Physical Therapy

## 2017-10-29 ENCOUNTER — Other Ambulatory Visit: Payer: Self-pay

## 2017-10-29 DIAGNOSIS — G8929 Other chronic pain: Secondary | ICD-10-CM | POA: Diagnosis not present

## 2017-10-29 DIAGNOSIS — M25512 Pain in left shoulder: Secondary | ICD-10-CM | POA: Insufficient documentation

## 2017-10-29 DIAGNOSIS — M25612 Stiffness of left shoulder, not elsewhere classified: Secondary | ICD-10-CM | POA: Diagnosis not present

## 2017-10-29 DIAGNOSIS — M6281 Muscle weakness (generalized): Secondary | ICD-10-CM | POA: Insufficient documentation
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
## 2017-11-06 ENCOUNTER — Ambulatory Visit: Payer: 59 | Admitting: Physical Therapy

## 2017-11-06 ENCOUNTER — Encounter: Payer: Self-pay | Admitting: Physical Therapy

## 2017-11-06 DIAGNOSIS — M25512 Pain in left shoulder: Secondary | ICD-10-CM | POA: Diagnosis not present

## 2017-11-06 DIAGNOSIS — M25612 Stiffness of left shoulder, not elsewhere classified: Secondary | ICD-10-CM

## 2017-11-06 DIAGNOSIS — M6281 Muscle weakness (generalized): Secondary | ICD-10-CM | POA: Diagnosis not present

## 2017-11-06 DIAGNOSIS — G8929 Other chronic pain: Secondary | ICD-10-CM | POA: Diagnosis not present

## 2017-11-06 NOTE — Therapy (Signed)
[facility] [address] [place], [place], [address] Phone: [phone]   Fax:  [phone]  Physical Therapy Treatment  Patient Details  Name: Dylan Rose MRN: [id number] Date of Birth: 13-Aug-1991 Referring Provider: Dr Wynn Banker    Encounter Date: 11/06/2017  PT End of Session - 11/06/17 0807    Visit Number  2    Number of Visits  12    Date for PT Re-Evaluation  12/11/17    Authorization Type  [facility]     PT Start Time  0802    PT Stop Time  0834    PT Time Calculation (min)  32 min       Past Medical History:  Diagnosis Date  . Medical history non-contributory     Past Surgical History:  Procedure Laterality Date  . FRACTURE SURGERY    . ORIF HUMERUS FRACTURE Left 06/30/2017   Procedure: OPEN REDUCTION INTERNAL FIXATION (ORIF) PROXIMAL HUMERUS FRACTURE;  Surgeon: Yolonda Kida, MD;  Location: [facility] OR;  Service: Orthopedics;  Laterality: Left;  180 mins    There were no vitals filed for this visit.  Subjective Assessment - 11/06/17 0805    Subjective  Pain up to 6/10. May have been due to [facility] trip.     Currently in Pain?  Yes    Pain Score  2     Pain Location  Shoulder    Pain Orientation  Right    Pain Descriptors / Indicators  Aching    Aggravating Factors   use of arm.     Pain Relieving Factors  rest, stretching                       [facility] Adult PT Treatment/Exercise - 11/06/17 0001      Shoulder Exercises: Supine   Other Supine Exercises  wand fleixon x10; ER x10; chest press x 10       Shoulder Exercises: Standing   Row Limitations  red x20     Retraction Limitations  red x 20     Other Standing Exercises  Standing cane all planes, cues for gentle ROM       Shoulder Exercises: Pulleys   Flexion  2 minutes    ABduction  2 minutes      Manual Therapy   Manual Therapy  Passive ROM    Passive ROM  Gentle PROM all planes                 PT Short Term Goals - 10/30/17 0806      PT SHORT TERM GOAL #1   Title  Psatient will increase passive ER by 10 degrees     Time  3    Period  Weeks    Status  New    Target Date  11/20/17      PT SHORT TERM GOAL #2   Title  Patient will increase passive flexion by 20 degrees     Time  3    Period  Weeks    Status  New    Target Date  11/20/17      PT SHORT TERM GOAL #3   Title  Patient will be independent with a program for scpaualr strengthening     Time  3    Period  Weeks    Status  New    Target Date  11/20/17      Additional  Short Term Goals   Additional Short Term Goals  Yes        PT Long Term Goals - 10/30/17 0807      PT LONG TERM GOAL #1   Title  Patient will be independent with stretching and strengthening program to maximize his outcome with upcoming suregry     Time  6    Period  Weeks    Status  New    Target Date  12/11/17      PT LONG TERM GOAL #2   Title  Patient will demsotrate 90 degrees of pain free shoulder flexion in order to reach up to a cabinet.     Time  6    Period  Weeks    Status  New    Target Date  12/11/17      PT LONG TERM GOAL #3   Title  Patient will demostrate 27% limitation on FOTO     Time  6    Period  Weeks    Status  New    Target Date  12/11/17            Plan - 11/06/17 0830    Clinical Impression Statement  Pt reports increased pain after [place]. He did not do anything strenuous however did sleep in the bed which he is used to sleeping in a recliner. He had not performed HEP in 4 days. We reviewed HEP and began standing cane AAROM. Pt reports no increased pain at end of treatment. Pt declined ice.     PT Next Visit Plan  light PROM; light mobilizations; scapular strengthening     PT Home Exercise Plan  scpa retraction/ shoulder extension; wane ER; wand flexion     Consulted and Agree with Plan of Care  Patient       Patient will benefit from skilled therapeutic  intervention in order to improve the following deficits and impairments:  Pain, Impaired UE functional use, Decreased activity tolerance, Decreased range of motion, Decreased endurance, Decreased strength, Postural dysfunction  Visit Diagnosis: Stiffness of left shoulder, not elsewhere classified  Chronic left shoulder pain  Muscle weakness (generalized)     Problem List Patient Active Problem List   Diagnosis Date Noted  . Closed fracture of left proximal humerus 06/30/2017  . Humeral head fracture, left, closed, initial encounter 06/30/2017    Sherrie Mustacheonoho, Baruc Tugwell McGee, PTA 11/06/2017, 8:45 AM  [facility] [address] [place], [place], [address] Phone: [phone]   Fax:  [phone]  Name: Kreg ShropshireCharles Elbert Passe IV MRN: [id number] Date of Birth: 09/30/1990

## 2017-11-10 ENCOUNTER — Encounter: Payer: Self-pay | Admitting: Physical Therapy

## 2017-11-10 ENCOUNTER — Ambulatory Visit: Payer: 59 | Admitting: Physical Therapy

## 2017-11-10 DIAGNOSIS — G8929 Other chronic pain: Secondary | ICD-10-CM

## 2017-11-10 DIAGNOSIS — M25512 Pain in left shoulder: Secondary | ICD-10-CM | POA: Diagnosis not present

## 2017-11-10 DIAGNOSIS — M6281 Muscle weakness (generalized): Secondary | ICD-10-CM

## 2017-11-10 DIAGNOSIS — M25612 Stiffness of left shoulder, not elsewhere classified: Secondary | ICD-10-CM | POA: Diagnosis not present

## 2017-11-10 NOTE — Therapy (Signed)
[facility] [address] [place], [place], [address] Phone: [phone]   Fax:  [phone]  Physical Therapy Treatment  Patient Details  Name: Dylan Rose MRN: [id number] Date of Birth: 08/26/1991 Referring Provider: Dr Wynn Banker    Encounter Date: 11/10/2017  PT End of Session - 11/10/17 0850    Visit Number  3    Number of Visits  12    Date for PT Re-Evaluation  12/11/17    Authorization Type  [facility]     PT Start Time  0845    PT Stop Time  0925    PT Time Calculation (min)  40 min    Activity Tolerance  Patient tolerated treatment well    Behavior During Therapy  [facility] for tasks assessed/performed       Past Medical History:  Diagnosis Date  . Medical history non-contributory     Past Surgical History:  Procedure Laterality Date  . FRACTURE SURGERY    . ORIF HUMERUS FRACTURE Left 06/30/2017   Procedure: OPEN REDUCTION INTERNAL FIXATION (ORIF) PROXIMAL HUMERUS FRACTURE;  Surgeon: Yolonda Kida, MD;  Location: [facility] OR;  Service: Orthopedics;  Laterality: Left;  180 mins    There were no vitals filed for this visit.  Subjective Assessment - 11/10/17 0847    Subjective  Patient reports his pain is back down to a 2/10. He has been doing his exercises.     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  Yes    Pain Score  2     Pain Location  Shoulder    Pain Orientation  Right    Pain Descriptors / Indicators  Aching    Pain Type  Chronic pain    Pain Onset  More than a month ago    Pain Frequency  Constant    Aggravating Factors   using his left arm     Pain Relieving Factors  rest and stretching     Multiple Pain Sites  No                      [facility] Adult PT Treatment/Exercise - 11/10/17 0001      Shoulder Exercises: Supine   Other Supine  Exercises  wand fleixon x10; ER x10; chest press x 10       Shoulder Exercises: Standing   Row Limitations  red x20     Retraction Limitations  red x 20     Other Standing Exercises  ball v table x20 cw/ccw/ forward back/ side to side       Shoulder Exercises: Pulleys   Flexion  2 minutes    ABduction  2 minutes      Manual Therapy   Passive ROM  Gentle PROM all planes              PT Education - 11/10/17 0848    Education provided  Yes    Education Details  reviewed HEP     Person(s) Educated  Patient    Methods  Explanation;Demonstration;Tactile cues;Verbal cues    Comprehension  Verbalized understanding;Returned demonstration;Verbal cues required;Tactile cues required       PT Short Term Goals - 11/10/17 1644      PT SHORT TERM GOAL #1   Title  Psatient will increase passive ER by 10 degrees     Time  3    Period  Weeks    Status  On-going      PT SHORT TERM GOAL #2   Title  Patient will increase passive flexion by 20 degrees     Time  3    Period  Weeks    Status  On-going      PT SHORT TERM GOAL #3   Title  Patient will be independent with a program for scpaualr strengthening     Time  3    Period  Weeks    Status  On-going        PT Long Term Goals - 10/30/17 0807      PT LONG TERM GOAL #1   Title  Patient will be independent with stretching and strengthening program to maximize his outcome with upcoming suregry     Time  6    Period  Weeks    Status  New    Target Date  12/11/17      PT LONG TERM GOAL #2   Title  Patient will demsotrate 90 degrees of pain free shoulder flexion in order to reach up to a cabinet.     Time  6    Period  Weeks    Status  New    Target Date  12/11/17      PT LONG TERM GOAL #3   Title  Patient will demostrate 27% limitation on FOTO     Time  6    Period  Weeks    Status  New    Target Date  12/11/17            Plan - 11/10/17 0910    Clinical Impression Statement  Patient tolerated exercises well.  Per visual inspection his motion has improved. He had no increase in pain with exercises. He will be getting a new bed. Therapy will consinue to work on Associate Professorscpaular strengthening exercises.     Clinical Presentation  Stable    Clinical Decision Making  Low    Rehab Potential  Good    PT Frequency  1x / week    PT Duration  6 weeks    PT Treatment/Interventions  ADLs/Self Care Home Management;Electrical Stimulation;Cryotherapy;Therapeutic exercise;Therapeutic activities;Neuromuscular re-education;Patient/family education;Passive range of motion;Manual techniques;Dry needling;Taping;Iontophoresis 4mg /ml Dexamethasone    PT Next Visit Plan  light PROM; light mobilizations; scapular strengthening; consdier finger ladder     PT Home Exercise Plan  scpa retraction/ shoulder extension; wane ER; wand flexion     Consulted and Agree with Plan of Care  Patient       Patient will benefit from skilled therapeutic intervention in order to improve the following deficits and impairments:  Pain, Impaired UE functional use, Decreased activity tolerance, Decreased range of motion, Decreased endurance, Decreased strength, Postural dysfunction  Visit Diagnosis: Stiffness of left shoulder, not elsewhere classified  Chronic left shoulder pain  Muscle weakness (generalized)     Problem List Patient Active Problem List   Diagnosis Date Noted  . Closed fracture of left proximal humerus 06/30/2017  . Humeral head fracture, left, closed, initial encounter 06/30/2017    Dessie Comaavid J Farryn Linares PT DPT  11/10/2017, 4:46 PM  [facility] [address] [place], [place], [address] Phone: [phone]   Fax:  [phone]  Name: Kreg ShropshireCharles Elbert Creer IV MRN: [id number] Date of Birth: 05/30/1991

## 2017-11-17 ENCOUNTER — Encounter: Payer: Self-pay | Admitting: Physical Therapy

## 2017-11-17 ENCOUNTER — Ambulatory Visit: Payer: 59 | Admitting: Physical Therapy

## 2017-11-17 DIAGNOSIS — M6281 Muscle weakness (generalized): Secondary | ICD-10-CM | POA: Diagnosis not present

## 2017-11-17 DIAGNOSIS — M25612 Stiffness of left shoulder, not elsewhere classified: Secondary | ICD-10-CM

## 2017-11-17 DIAGNOSIS — G8929 Other chronic pain: Secondary | ICD-10-CM

## 2017-11-17 DIAGNOSIS — M25512 Pain in left shoulder: Secondary | ICD-10-CM | POA: Diagnosis not present

## 2017-11-17 NOTE — Patient Instructions (Signed)
Sh isometrics issued from Ex drawer Daily,  10 X 5 second hold Avoid Pain

## 2017-11-17 NOTE — Therapy (Signed)
[facility] [address] [place], [place], [address] Phone: [phone]   Fax:  [phone]  Physical Therapy Treatment  Patient Details  Name: Dylan Rose MRN: [id number] Date of Birth: 04/02/1991 Referring Provider: Dr Wynn BankerJason patrick Rodgers    Encounter Date: 11/17/2017  PT End of Session - 11/17/17 0844    Visit Number  4    Number of Visits  12    Date for PT Re-Evaluation  12/11/17    PT Start Time  0803    PT Stop Time  0843    PT Time Calculation (min)  40 min    Activity Tolerance  Patient tolerated treatment well    Behavior During Therapy  [facility] for tasks assessed/performed       Past Medical History:  Diagnosis Date  . Medical history non-contributory     Past Surgical History:  Procedure Laterality Date  . FRACTURE SURGERY    . ORIF HUMERUS FRACTURE Left 06/30/2017   Procedure: OPEN REDUCTION INTERNAL FIXATION (ORIF) PROXIMAL HUMERUS FRACTURE;  Surgeon: Yolonda Kidaogers, Jason Patrick, MD;  [facility];  Service: Orthopedics;  Laterality: Left;  180 mins    There were no vitals filed for this visit.  Subjective Assessment - 11/17/17 0805    Subjective  Highest pain yesterday.  6/10 Pops with drivine. Able to sleep in bed for first time in 5 months.    Currently in Pain?  Yes    Pain Score  2  up to 6/10    Pain Descriptors / Indicators  -- feels like bone in mashed in the socket. and i need to work it out.     Aggravating Factors   sleeping,  using arm  first thing in am    Pain Relieving Factors  rest  stretching         [facility] PT Assessment - 11/17/17 0001      AROM   Left Shoulder Flexion  100 Degrees 105 scaption    Left Shoulder External Rotation  32 Degrees pulling      PROM   Left Shoulder External Rotation  30 Degrees                  [facility] Adult PT Treatment/Exercise - 11/17/17 0001      Shoulder Exercises: Seated   Other Seated Exercises  UE ranger 5 positions 10 x each  cued for pain free      Shoulder Exercises: Standing   Other Standing Exercises   wall ladder flexion 10 x all rungs reach and hold at top      Shoulder Exercises: Isometric Strengthening   Flexion Limitations  10 x 5 light due to bicep pain    Extension Limitations  10 X 5 seconds    External Rotation Limitations  10 X 5 seconds    ABduction Limitations  10 x 5 seconds      Manual Therapy   Manual Therapy  Passive ROM    Manual therapy comments  soft tissur work treres area light ,  very sensitine tight spasm    Passive ROM  Gentle PROM flexion             PT Education - 11/17/17 0843    Education provided  Yes    Education Details  HEP    Person(s) Educated  Patient    Methods  Explanation;Demonstration;Verbal cues;Handout    Comprehension  Verbalized understanding;Returned demonstration       PT  Short Term Goals - 11/10/17 1644      PT SHORT TERM GOAL #1   Title  Psatient will increase passive ER by 10 degrees     Time  3    Period  Weeks    Status  On-going      PT SHORT TERM GOAL #2   Title  Patient will increase passive flexion by 20 degrees     Time  3    Period  Weeks    Status  On-going      PT SHORT TERM GOAL #3   Title  Patient will be independent with a program for scpaualr strengthening     Time  3    Period  Weeks    Status  On-going        PT Long Term Goals - 10/30/17 0807      PT LONG TERM GOAL #1   Title  Patient will be independent with stretching and strengthening program to maximize his outcome with upcoming suregry     Time  6    Period  Weeks    Status  New    Target Date  12/11/17      PT LONG TERM GOAL #2   Title  Patient will demsotrate 90 degrees of pain free shoulder flexion in order to reach up to a cabinet.     Time  6    Period  Weeks    Status  New    Target Date  12/11/17      PT LONG TERM GOAL #3   Title  Patient will demostrate 27% limitation on FOTO     Time  6    Period  Weeks    Status  New    Target  Date  12/11/17            Plan - 11/17/17 0844    Clinical Impression Statement  pain 4/10 mainly from soft tissue work.  Progresses HEp today [id number],  AROM flexion 105.  Progress toward ROM goals,  HEP goals.    PT Next Visit Plan  light PROM; light mobilizations; scapular strengthening;  finger ladder ,  work on teres    PT Home Exercise Plan  scpa retraction/ shoulder extension; wane ER; wand flexion ,  Isometric light    Consulted and Agree with Plan of Care  Patient       Patient will benefit from skilled therapeutic intervention in order to improve the following deficits and impairments:     Visit Diagnosis: Stiffness of left shoulder, not elsewhere classified  Chronic left shoulder pain  Muscle weakness (generalized)     Problem List Patient Active Problem List   Diagnosis Date Noted  . Closed fracture of left proximal humerus 06/30/2017  . Humeral head fracture, left, closed, initial encounter 06/30/2017    [facility] PTA 11/17/2017, 8:46 AM  [facility] [address] [place], [place], [address] Phone: [phone]   Fax:  [phone]  Name: Dylan Rose MRN: [id number] Date of Birth: Jun 30, 1991

## 2017-11-18 ENCOUNTER — Encounter: Payer: 59 | Admitting: Physical Therapy

## 2017-11-25 ENCOUNTER — Encounter: Payer: Self-pay | Admitting: Physical Therapy

## 2017-11-25 ENCOUNTER — Ambulatory Visit: Payer: 59 | Attending: Orthopedic Surgery | Admitting: Physical Therapy

## 2017-11-25 DIAGNOSIS — M25612 Stiffness of left shoulder, not elsewhere classified: Secondary | ICD-10-CM | POA: Insufficient documentation

## 2017-11-25 DIAGNOSIS — M25512 Pain in left shoulder: Secondary | ICD-10-CM | POA: Diagnosis not present

## 2017-11-25 DIAGNOSIS — G8929 Other chronic pain: Secondary | ICD-10-CM | POA: Diagnosis not present

## 2017-11-25 DIAGNOSIS — M6281 Muscle weakness (generalized): Secondary | ICD-10-CM | POA: Diagnosis not present

## 2017-11-25 NOTE — Therapy (Signed)
[facility] [address] [place], [place], [address] Phone: [phone]   Fax:  [phone]  Physical Therapy Treatment  Patient Details  Name: Dylan Rose MRN: [id number] Date of Birth: 02/12/1991 Referring Provider: Dr Wynn BankerJason patrick Rodgers    Encounter Date: 11/25/2017  PT End of Session - 11/25/17 0851    Visit Number  5    Number of Visits  12    Date for PT Re-Evaluation  12/11/17    Authorization Type  [facility]     PT Start Time  0800    PT Stop Time  [phone] [id number]    PT Time Calculation (min)  42 min    Activity Tolerance  Patient tolerated treatment well    Behavior During Therapy  [facility] for tasks assessed/performed       Past Medical History:  Diagnosis Date  . Medical history non-contributory     Past Surgical History:  Procedure Laterality Date  . FRACTURE SURGERY    . ORIF HUMERUS FRACTURE Left 06/30/2017   Procedure: OPEN REDUCTION INTERNAL FIXATION (ORIF) PROXIMAL HUMERUS FRACTURE;  Surgeon: Yolonda Kidaogers, Jason Patrick, MD;  [facility];  Service: Orthopedics;  Laterality: Left;  180 mins    There were no vitals filed for this visit.  Subjective Assessment - 11/25/17 0849    Subjective  Patient rpeorts the shoulder has been feeling OK. He feels like he can reach out at the drive through better. He notivced a pop this morning when he was giving his credit card.     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  Yes    Pain Score  2     Pain Location  Shoulder    Pain Orientation  Right    Pain Descriptors / Indicators  Aching    Pain Type  Chronic pain    Pain Onset  More than a month ago    Pain Frequency  Constant    Aggravating Factors   sleeping and using his arm     Pain Relieving Factors  resyt and stretching                       [facility] Adult PT  Treatment/Exercise - 11/25/17 0001      Shoulder Exercises: Supine   Other Supine Exercises  wand fleixon x10; ER x10; chest press x 10       Shoulder Exercises: Seated   Other Seated Exercises  UE ranger 5 positions 10 x each cued for pain free      Shoulder Exercises: Standing   Row Limitations  Green  x20     Retraction Limitations  Green  x 20       Shoulder Exercises: Pulleys   Flexion  2 minutes    ABduction  2 minutes      Shoulder Exercises: Isometric Strengthening   Flexion Limitations  10 x 5 light due to bicep pain    Extension Limitations  10 X 5 seconds    External Rotation Limitations  10 X 5 seconds    ABduction Limitations  10 x 5 seconds      Manual Therapy   Manual Therapy  Passive ROM    Manual therapy comments  soft tissur work treres area light ,  very sensitine tight  spasm    Passive ROM  Gentle PROM flexion             PT Education - 11/25/17 0851    Education provided  Yes    Education Details  reviewed ther-ex     Person(s) Educated  Patient    Methods  Explanation;Demonstration;Tactile cues    Comprehension  Verbalized understanding;Returned demonstration;Verbal cues required;Tactile cues required       PT Short Term Goals - 11/25/17 1622      PT SHORT TERM GOAL #1   Title  Psatient will increase passive ER by 10 degrees     Time  3    Period  Weeks    Status  On-going      PT SHORT TERM GOAL #2   Title  Patient will increase passive flexion by 20 degrees     Time  3    Period  Weeks    Status  On-going      PT SHORT TERM GOAL #3   Title  Patient will be independent with a program for scpaualr strengthening     Time  3    Period  Weeks    Status  On-going        PT Long Term Goals - 10/30/17 0807      PT LONG TERM GOAL #1   Title  Patient will be independent with stretching and strengthening program to maximize his outcome with upcoming suregry     Time  6    Period  Weeks    Status  New    Target Date  12/11/17       PT LONG TERM GOAL #2   Title  Patient will demsotrate 90 degrees of pain free shoulder flexion in order to reach up to a cabinet.     Time  6    Period  Weeks    Status  New    Target Date  12/11/17      PT LONG TERM GOAL #3   Title  Patient will demostrate 27% limitation on FOTO     Time  6    Period  Weeks    Status  New    Target Date  12/11/17            Plan - 11/25/17 0851    Clinical Impression Statement  Patient continues totolerate exercises and stretching well. Per visual inspection the patients flexion has improved. He had no incedences of popping with treatment.     Clinical Presentation  Stable    Clinical Decision Making  Low    PT Frequency  1x / week    PT Duration  6 weeks    PT Treatment/Interventions  ADLs/Self Care Home Management;Electrical Stimulation;Cryotherapy;Therapeutic exercise;Therapeutic activities;Neuromuscular re-education;Patient/family education;Passive range of motion;Manual techniques;Dry needling;Taping;Iontophoresis 4mg /ml Dexamethasone    PT Next Visit Plan  light PROM; light mobilizations; scapular strengthening;  finger ladder ,  work on teres    PT Home Exercise Plan  scpa retraction/ shoulder extension; wane ER; wand flexion ,  Isometric light    Consulted and Agree with Plan of Care  Patient       Patient will benefit from skilled therapeutic intervention in order to improve the following deficits and impairments:  Pain, Impaired UE functional use, Decreased activity tolerance, Decreased range of motion, Decreased endurance, Decreased strength, Postural dysfunction  Visit Diagnosis: Stiffness of left shoulder, not elsewhere classified  Chronic left shoulder pain  Muscle weakness (generalized)  Problem List Patient Active Problem List   Diagnosis Date Noted  . Closed fracture of left proximal humerus 06/30/2017  . Humeral head fracture, left, closed, initial encounter 06/30/2017    Dessie Coma PT DPT  11/25/2017,  4:23 PM  [facility] [address] [place], [place], [address] Phone: [phone]   Fax:  [phone]  Name: Dylan Rose MRN: [id number] Date of Birth: [birth year]

## 2017-12-01 ENCOUNTER — Encounter: Payer: Self-pay | Admitting: Physical Therapy

## 2017-12-01 ENCOUNTER — Ambulatory Visit: Payer: 59 | Admitting: Physical Therapy

## 2017-12-01 DIAGNOSIS — M25612 Stiffness of left shoulder, not elsewhere classified: Secondary | ICD-10-CM | POA: Diagnosis not present

## 2017-12-01 DIAGNOSIS — M25512 Pain in left shoulder: Secondary | ICD-10-CM | POA: Diagnosis not present

## 2017-12-01 DIAGNOSIS — G8929 Other chronic pain: Secondary | ICD-10-CM

## 2017-12-01 DIAGNOSIS — M6281 Muscle weakness (generalized): Secondary | ICD-10-CM | POA: Diagnosis not present

## 2017-12-01 NOTE — Therapy (Signed)
[facility] [address] [place], [place], [address] Phone: [phone]   Fax:  [phone]  Physical Therapy Treatment  Patient Details  Name: Stephfon Bovey MRN: [id number] Date of Birth: 09/05/1991 Referring Provider: Dr Wynn Banker    Encounter Date: 12/01/2017  PT End of Session - 12/01/17 1026    Visit Number  6    Number of Visits  12    Date for PT Re-Evaluation  12/11/17    Authorization Type  [facility]     PT Start Time  0845    PT Stop Time  0924    PT Time Calculation (min)  39 min    Activity Tolerance  Patient tolerated treatment well    Behavior During Therapy  [facility] for tasks assessed/performed       Past Medical History:  Diagnosis Date  . Medical history non-contributory     Past Surgical History:  Procedure Laterality Date  . FRACTURE SURGERY    . ORIF HUMERUS FRACTURE Left 06/30/2017   Procedure: OPEN REDUCTION INTERNAL FIXATION (ORIF) PROXIMAL HUMERUS FRACTURE;  Surgeon: Yolonda Kida, MD;  Location: [facility] OR;  Service: Orthopedics;  Laterality: Left;  180 mins    There were no vitals filed for this visit.  Subjective Assessment - 12/01/17 0924    Subjective  Patient continues to report very little pain. He has been working on his exercises.     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  Yes                      [facility] Adult PT Treatment/Exercise - 12/01/17 0001      Shoulder Exercises: Supine   Other Supine Exercises  wand fleixon x10; ER x10; chest press x 10       Shoulder Exercises: Seated   Other Seated Exercises  UE ranger 5 positions 10 x each cued for pain free      Shoulder Exercises: Standing   Row Limitations  Green  x20     Retraction Limitations  Green  x 20     Other Standing Exercises  ball v wall 10 x cw 10x  ccw;       Shoulder Exercises: Pulleys   Flexion  2 minutes    ABduction  2 minutes      Shoulder Exercises: ROM/Strengthening   Cybex Row  3 plate;20 reps each handle       Shoulder Exercises: Isometric Strengthening   Flexion Limitations  10 x 5 light due to bicep pain    Extension Limitations  10 X 5 seconds    External Rotation Limitations  10 X 5 seconds    ABduction Limitations  10 x 5 seconds      Manual Therapy   Manual Therapy  Passive ROM    Manual therapy comments  soft tissur work treres area light ,  very sensitine tight spasm    Passive ROM  Gentle PROM flexion             PT Education - 12/01/17 1026    Education provided  Yes    Education Details  symptom mamgement with machines     Person(s) Educated  Patient    Methods  Explanation;Demonstration;Tactile cues;Verbal cues    Comprehension  Verbalized understanding;Returned demonstration;Verbal cues required;Tactile cues required       PT Short Term Goals - 12/01/17 1201      PT SHORT TERM GOAL #1   Title  Psatient will increase passive ER by 10 degrees     Time  3    Period  Weeks    Status  On-going      PT SHORT TERM GOAL #2   Title  Patient will increase passive flexion by 20 degrees     Time  3    Period  Weeks    Status  On-going      PT SHORT TERM GOAL #3   Title  Patient will be independent with a program for scpaualr strengthening     Time  3    Period  Weeks    Status  On-going        PT Long Term Goals - 10/30/17 0807      PT LONG TERM GOAL #1   Title  Patient will be independent with stretching and strengthening program to maximize his outcome with upcoming suregry     Time  6    Period  Weeks    Status  New    Target Date  12/11/17      PT LONG TERM GOAL #2   Title  Patient will demsotrate 90 degrees of pain free shoulder flexion in order to reach up to a cabinet.     Time  6    Period  Weeks    Status  New    Target Date  12/11/17      PT LONG TERM GOAL #3    Title  Patient will demostrate 27% limitation on FOTO     Time  6    Period  Weeks    Status  New    Target Date  12/11/17            Plan - 12/01/17 1201    Clinical Impression Statement  Patient tolerated machine strengtheing well. He had no increase in pain. therapy also added ball v wall exercise. He reported no pain.     Clinical Presentation  Stable    Clinical Decision Making  Low    Rehab Potential  Good    PT Frequency  1x / week    PT Duration  6 weeks    PT Treatment/Interventions  ADLs/Self Care Home Management;Electrical Stimulation;Cryotherapy;Therapeutic exercise;Therapeutic activities;Neuromuscular re-education;Patient/family education;Passive range of motion;Manual techniques;Dry needling;Taping;Iontophoresis 4mg /ml Dexamethasone    PT Next Visit Plan  light PROM; light mobilizations; scapular strengthening;  finger ladder ,  work on teres    PT Home Exercise Plan  scpa retraction/ shoulder extension; wane ER; wand flexion ,  Isometric light    Consulted and Agree with Plan of Care  Patient       Patient will benefit from skilled therapeutic intervention in order to improve the following deficits and impairments:  Pain, Impaired UE functional use, Decreased activity tolerance, Decreased range of motion, Decreased endurance, Decreased strength, Postural dysfunction  Visit Diagnosis: Stiffness of left shoulder, not elsewhere classified  Chronic left shoulder pain  Muscle weakness (generalized)     Problem List Patient Active Problem List   Diagnosis Date Noted  . Closed fracture of left proximal humerus 06/30/2017  . Humeral head fracture, left, closed, initial encounter 06/30/2017    Dessie Coma  PT DPT  12/01/2017, 12:08 PM  [facility] [address] [place],  [place], [address] Phone: [phone]   Fax:  [phone]  Name: Kreg ShropshireCharles Elbert Poli IV MRN: [id number] Date of Birth: 07/24/1991

## 2017-12-02 ENCOUNTER — Encounter: Payer: 59 | Admitting: Physical Therapy

## 2017-12-09 ENCOUNTER — Encounter: Payer: Self-pay | Admitting: Physical Therapy

## 2017-12-09 ENCOUNTER — Ambulatory Visit: Payer: 59 | Admitting: Physical Therapy

## 2017-12-09 DIAGNOSIS — M6281 Muscle weakness (generalized): Secondary | ICD-10-CM

## 2017-12-09 DIAGNOSIS — G8929 Other chronic pain: Secondary | ICD-10-CM | POA: Diagnosis not present

## 2017-12-09 DIAGNOSIS — M25612 Stiffness of left shoulder, not elsewhere classified: Secondary | ICD-10-CM | POA: Diagnosis not present

## 2017-12-09 DIAGNOSIS — M25512 Pain in left shoulder: Secondary | ICD-10-CM

## 2017-12-09 NOTE — Therapy (Signed)
[place], [place], [address] Phone: [phone]   Fax:  [phone]  Physical Therapy Treatment  Patient Details  Name: Dylan Rose MRN: [id number] Date of Birth: 08-21-1991 Referring Provider: Dr Lyndel Pleasure    Encounter Date: 12/09/2017  PT End of Session - 12/09/17 0805    Visit Number  7    Number of Visits  12    Date for PT Re-Evaluation  12/11/17    Authorization Type  [employer]     PT Start Time  0800    PT Stop Time  [phone]    PT Time Calculation (min)  38 min       Past Medical History:  Diagnosis Date  . Medical history non-contributory     Past Surgical History:  Procedure Laterality Date  . FRACTURE SURGERY    . ORIF HUMERUS FRACTURE Left 06/30/2017   Procedure: OPEN REDUCTION INTERNAL FIXATION (ORIF) PROXIMAL HUMERUS FRACTURE;  Surgeon: Nicholes Stairs, MD;  Location: [facility];  Service: Orthopedics;  Laterality: Left;  180 mins    There were no vitals filed for this visit.  Subjective Assessment - 12/09/17 0804    Currently in Pain?  Yes    Pain Score  2     Pain Location  Shoulder    Pain Orientation  Right    Pain Descriptors / Indicators  Aching    Aggravating Factors   when it locks up once a day    Pain Relieving Factors  rest and stretching          [facility] PT Assessment - 12/09/17 0001      Observation/Other Assessments   Focus on Therapeutic Outcomes (FOTO)   37% limited improved from  41% limited       AROM   AROM Assessment Site  Shoulder    Right/Left Shoulder  Left    Left Shoulder Flexion  135 Degrees    Left Shoulder ABduction  120 Degrees    Left Shoulder Internal Rotation  -- reach to left side     Left Shoulder External Rotation  -- reach to occiput      PROM   Left Shoulder Internal Rotation  50 Degrees    Left Shoulder External Rotation  32 Degrees                  [facility] Adult PT Treatment/Exercise -  12/09/17 0001      Shoulder Exercises: Supine   Other Supine Exercises  wand fleixon x10; ER x10; chest press x 10  2#      Shoulder Exercises: Seated   Other Seated Exercises  UE ranger 5 positions 10 x each cued for pain free      Shoulder Exercises: Standing   Row Limitations  Green  x20     Retraction Limitations  Green  x 20     Other Standing Exercises  ball v wall 10 x cw 10x ccw;       Shoulder Exercises: Pulleys   Flexion  2 minutes    ABduction  2 minutes      Shoulder Exercises: ROM/Strengthening   Cybex Row  3 plate;20 reps each handle       Shoulder Exercises: Isometric Strengthening   Flexion Limitations  10 x 5 light due to bicep pain    Extension Limitations  10 X 5 seconds    External Rotation Limitations  10 X 5 seconds  ABduction Limitations  10 x 5 seconds      Manual Therapy   Passive ROM  Gentle PROM flexion, ER                PT Short Term Goals - 12/09/17 2458      PT SHORT TERM GOAL #1   Title  Psatient will increase passive ER by 10 degrees     Time  3    Period  Weeks    Status  Achieved      PT SHORT TERM GOAL #2   Title  Patient will increase passive flexion by 20 degrees     Time  3    Period  Weeks    Status  Achieved      PT SHORT TERM GOAL #3   Time  3    Period  Weeks    Status  Achieved        PT Long Term Goals - 12/09/17 0998      PT LONG TERM GOAL #1   Title  Patient will be independent with stretching and strengthening program to maximize his outcome with upcoming suregry     Baseline  independent thus far    Time  6    Period  Weeks    Status  Partially Met      PT LONG TERM GOAL #2   Title  Patient will demsotrate 90 degrees of pain free shoulder flexion in order to reach up to a cabinet.     Time  6    Period  Weeks    Status  Achieved      PT LONG TERM GOAL #3   Title  Patient will demostrate 27% limitation on FOTO     Baseline  37%    Time  6    Period  Weeks    Status  On-going             Plan - 12/09/17 0806    Clinical Impression Statement  Pt reports he can reach out to pay when going through a drive thru and he can use the ATM with some effort without getting out of the car. His AROM and PROM significantly improved. He can reach into overhead cabinets for light items. His FOTO sore improved. He sees his MD next week and may be scheduled for surgery. Possible discharge to [facility].     PT Next Visit Plan  light PROM; light mobilizations; scapular strengthening;  finger ladder ,  work on teres    PT Home Exercise Plan  scpa retraction/ shoulder extension; wane ER; wand flexion ,  Isometric light    Consulted and Agree with Plan of Care  Patient       Patient will benefit from skilled therapeutic intervention in order to improve the following deficits and impairments:  Pain, Impaired UE functional use, Decreased activity tolerance, Decreased range of motion, Decreased endurance, Decreased strength, Postural dysfunction  Visit Diagnosis: Stiffness of left shoulder, not elsewhere classified  Chronic left shoulder pain  Muscle weakness (generalized)     Problem List Patient Active Problem List   Diagnosis Date Noted  . Closed fracture of left proximal humerus 06/30/2017  . Humeral head fracture, left, closed, initial encounter 06/30/2017    Dorene Ar, PTA 12/09/2017, 8:40 AM  [place], [place], [address] Phone: [phone]   Fax:  [phone]  Name: Dylan Rose MRN: [id number] Date of  Birth: 04-Apr-1991

## 2017-12-16 DIAGNOSIS — M25512 Pain in left shoulder: Secondary | ICD-10-CM | POA: Diagnosis not present

## 2017-12-17 ENCOUNTER — Encounter: Payer: Self-pay | Admitting: Physical Therapy

## 2017-12-17 ENCOUNTER — Ambulatory Visit: Payer: 59 | Admitting: Physical Therapy

## 2017-12-17 DIAGNOSIS — G8929 Other chronic pain: Secondary | ICD-10-CM

## 2017-12-17 DIAGNOSIS — M25512 Pain in left shoulder: Secondary | ICD-10-CM

## 2017-12-17 DIAGNOSIS — M6281 Muscle weakness (generalized): Secondary | ICD-10-CM | POA: Diagnosis not present

## 2017-12-17 DIAGNOSIS — M25612 Stiffness of left shoulder, not elsewhere classified: Secondary | ICD-10-CM

## 2017-12-17 NOTE — Therapy (Addendum)
[employer] [place], [place], [address] Phone: [phone]   Fax:  [phone]  Physical Therapy Treatment/ Discharge   Patient Details  Name: Dylan Rose MRN: [id number] Date of Birth: 1991-02-06 Referring Provider: Dr Lyndel Pleasure    Encounter Date: 12/17/2017  PT End of Session - 12/17/17 1539    Visit Number  8    Number of Visits  16    Date for PT Re-Evaluation  01/14/18    Authorization Type  [employer]     PT Start Time  0800    PT Stop Time  0841    PT Time Calculation (min)  41 min    Activity Tolerance  Patient tolerated treatment well    Behavior During Therapy  [facility] for tasks assessed/performed       Past Medical History:  Diagnosis Date  . Medical history non-contributory     Past Surgical History:  Procedure Laterality Date  . FRACTURE SURGERY    . ORIF HUMERUS FRACTURE Left 06/30/2017   Procedure: OPEN REDUCTION INTERNAL FIXATION (ORIF) PROXIMAL HUMERUS FRACTURE;  Surgeon: Nicholes Stairs, MD;  Location: [facility];  Service: Orthopedics;  Laterality: Left;  180 mins    There were no vitals filed for this visit.  Subjective Assessment - 12/17/17 1537    Subjective  Patient reports the MD has given him the option of surgery or to hold off. He has not decided what he will do.     Diagnostic tests  Recent X-ray shows avascular necrosis of the humeral head     Patient Stated Goals  to have     Currently in Pain?  Yes    Pain Score  2     Pain Location  Shoulder    Pain Orientation  Right    Pain Descriptors / Indicators  Aching    Pain Type  Chronic pain    Pain Onset  More than a month ago    Pain Frequency  Constant    Aggravating Factors   when it locks up     Pain Relieving Factors  rest and stretching          [facility] PT Assessment - 12/17/17 0001      AROM   Left Shoulder Flexion  130 Degrees    Left Shoulder Internal Rotation  -- reach to left side      Left Shoulder External Rotation  -- reach to occiput      PROM   Left Shoulder Flexion  120 Degrees    Left Shoulder Internal Rotation  50 Degrees    Left Shoulder External Rotation  32 Degrees            No data recorded       [facility] Adult PT Treatment/Exercise - 12/17/17 0001      Shoulder Exercises: Supine   Other Supine Exercises  wand fleixon x10; ER x10; chest press x 10  2#      Shoulder Exercises: Seated   Other Seated Exercises  UE flexion 20x       Shoulder Exercises: Standing   Row Limitations  --    Retraction Limitations  --    Other Standing Exercises  ball v wall 10 x cw 10x ccw;       Shoulder Exercises: Pulleys   Flexion  2 minutes    ABduction  2 minutes      Shoulder Exercises: ROM/Strengthening  Cybex Row  Limitations    Cybex Row Limitations  25 lbs x20 each handle     Other ROM/Strengthening Exercises  lat pull down 2x10 15 lbs       Shoulder Exercises: Isometric Strengthening   Flexion Limitations  10 x 5 light due to bicep pain    Extension Limitations  10 X 5 seconds    External Rotation Limitations  10 X 5 seconds    ABduction Limitations  10 x 5 seconds      Manual Therapy   Manual Therapy  Passive ROM    Passive ROM  Gentle PROM flexion, ER              PT Education - 12/17/17 1539    Education provided  Yes    Education Details  reviewed [facility]     Person(s) Educated  Patient    Methods  Explanation;Demonstration;Tactile cues;Verbal cues    Comprehension  Verbalized understanding;Verbal cues required;Tactile cues required;Returned demonstration       PT Short Term Goals - 12/09/17 0837      PT SHORT TERM GOAL #1   Title  Psatient will increase passive ER by 10 degrees     Time  3    Period  Weeks    Status  Achieved      PT SHORT TERM GOAL #2   Title  Patient will increase passive flexion by 20 degrees     Time  3    Period  Weeks    Status  Achieved      PT SHORT TERM GOAL #3   Time  3    Period  Weeks     Status  Achieved        PT Long Term Goals - 12/09/17 9528      PT LONG TERM GOAL #1   Title  Patient will be independent with stretching and strengthening program to maximize his outcome with upcoming suregry     Baseline  independent thus far    Time  6    Period  Weeks    Status  Partially Met      PT LONG TERM GOAL #2   Title  Patient will demsotrate 90 degrees of pain free shoulder flexion in order to reach up to a cabinet.     Time  6    Period  Weeks    Status  Achieved      PT LONG TERM GOAL #3   Title  Patient will demostrate 27% limitation on FOTO     Baseline  37%    Time  6    Period  Weeks    Status  On-going            Plan - 12/17/17 1540    Clinical Impression Statement  Therapy assessed patients motio., It has improved. His fucntional reach has improved as well. The patient is not sure what he wants to do. If he does not have surgery he may continue. If he has surgery he will D/C to [facility]. Patient to make his decision soon.     Clinical Presentation  Stable    Clinical Decision Making  Low    Rehab Potential  Good    PT Frequency  1x / week    PT Duration  6 weeks    PT Treatment/Interventions  ADLs/Self Care Home Management;Electrical Stimulation;Cryotherapy;Therapeutic exercise;Therapeutic activities;Neuromuscular re-education;Patient/family education;Passive range of motion;Manual techniques;Dry needling;Taping;Iontophoresis 24m/ml Dexamethasone    PT Next Visit Plan  light PROM; light mobilizations; scapular strengthening;  finger ladder ,  work on teres    Consulted and Agree with Plan of Care  Patient       Patient will benefit from skilled therapeutic intervention in order to improve the following deficits and impairments:  Pain, Impaired UE functional use, Decreased activity tolerance, Decreased range of motion, Decreased endurance, Decreased strength, Postural dysfunction  Visit Diagnosis: Stiffness of left shoulder, not elsewhere  classified - Plan: PT plan of care cert/re-cert  Chronic left shoulder pain - Plan: PT plan of care cert/re-cert  Muscle weakness (generalized) - Plan: PT plan of care cert/re-cert  PHYSICAL THERAPY DISCHARGE SUMMARY  Visits from Start of Care: 8  Current functional level related to goals / functional outcomes: Improved motion and use of the shoulder. Patient scheduled for surgery   Remaining deficits: Still significant limitations in movement and strength    Education / Equipment:  HEP Plan: Patient agrees to discharge.  Patient goals were not met. Patient is being discharged due to a change in medical status.  ?????       Problem List Patient Active Problem List   Diagnosis Date Noted  . Closed fracture of left proximal humerus 06/30/2017  . Humeral head fracture, left, closed, initial encounter 06/30/2017    Carney Living PT DPT  12/17/2017, 5:14 PM  Cooper Render SPT  12/17/2017   During this treatment session, the therapist was present, participating in and directing the treatment. [place], [place], [address] Phone: [phone]   Fax:  [phone]  Name: Dylan Rose MRN: [id number] Date of Birth: [birth year]

## 2017-12-28 ENCOUNTER — Other Ambulatory Visit: Payer: Self-pay | Admitting: Orthopedic Surgery

## 2018-01-01 DIAGNOSIS — M25612 Stiffness of left shoulder, not elsewhere classified: Secondary | ICD-10-CM | POA: Diagnosis not present

## 2018-01-01 DIAGNOSIS — M25512 Pain in left shoulder: Secondary | ICD-10-CM | POA: Diagnosis not present

## 2018-01-22 NOTE — Pre-Procedure Instructions (Signed)
Dylan Rose  01/22/2018      [id number] - [place], [place] - [address] AT CORNER OF [facility] [address] [place] [address] Phone: [phone] Fax: [phone]    Your procedure is scheduled on 01/28/2018.  Report to [name] Admitting at 0530 A.M.  Call this number if you have problems the morning of surgery:  [phone]   Remember:  Do not eat food or drink liquids after midnight.  Take these medicines the morning of surgery with A SIP OF WATER: NONE   Do not wear jewelry.  Do not wear lotions, powders, or colognes, or deodorant.  Men may shave face and neck.  Do not bring valuables to the hospital.  [facility] is not responsible for any belongings or valuables.  Hearing aids, eyeglasses, contacts, dentures or bridgework may not be worn into surgery.  Leave your suitcase in the car.  After surgery it may be brought to your room.  For patients admitted to the hospital, discharge time will be determined by your treatment team.  Patients discharged the day of surgery will not be allowed to drive home.   Name and phone number of your driver:    Special instructions:   [facility]- Preparing For Surgery  Before surgery, you can play an important role. Because skin is not sterile, your skin needs to be as free of germs as possible. You can reduce the number of germs on your skin by washing with CHG (chlorahexidine gluconate) Soap before surgery.  CHG is an antiseptic cleaner which kills germs and bonds with the skin to continue killing germs even after washing.  Please do not use if you have an allergy to CHG or antibacterial soaps. If your skin becomes reddened/irritated stop using the CHG.  Do not shave (including legs and underarms) for at least 48 hours prior to first CHG shower. It is OK to shave your face.  Please follow these instructions carefully.   1. Shower the NIGHT BEFORE SURGERY and the  MORNING OF SURGERY with CHG.   2. If you chose to wash your hair, wash your hair first as usual with your normal shampoo.  3. After you shampoo, rinse your hair and body thoroughly to remove the shampoo.  4. Use CHG as you would any other liquid soap. You can apply CHG directly to the skin and wash gently with a scrungie or a clean washcloth.   5. Apply the CHG Soap to your body ONLY FROM THE NECK DOWN.  Do not use on open wounds or open sores. Avoid contact with your eyes, ears, mouth and genitals (private parts). Wash Face and genitals (private parts)  with your normal soap.  6. Wash thoroughly, paying special attention to the area where your surgery will be performed.  7. Thoroughly rinse your body with warm water from the neck down.  8. DO NOT shower/wash with your normal soap after using and rinsing off the CHG Soap.  9. Pat yourself dry with a CLEAN TOWEL.  10. Wear CLEAN PAJAMAS to bed the night before surgery, wear comfortable clothes the morning of surgery  11. Place CLEAN SHEETS on your bed the night of your first shower and DO NOT SLEEP WITH PETS.    Day of Surgery: Shower as stated above. Do not apply any deodorants/lotions.  Please wear clean clothes to the hospital/surgery [facility].      Please read over the following fact sheets that  you were given.

## 2018-01-25 ENCOUNTER — Ambulatory Visit (HOSPITAL_COMMUNITY)
Admission: RE | Admit: 2018-01-25 | Discharge: 2018-01-25 | Disposition: A | Discharge status: Home / Self Care | Payer: 59 | Source: Ambulatory Visit | Attending: Orthopedic Surgery | Admitting: Orthopedic Surgery

## 2018-01-25 ENCOUNTER — Encounter (HOSPITAL_COMMUNITY): Payer: Self-pay

## 2018-01-25 ENCOUNTER — Other Ambulatory Visit: Payer: Self-pay

## 2018-01-25 ENCOUNTER — Encounter (HOSPITAL_COMMUNITY)
Admission: RE | Admit: 2018-01-25 | Discharge: 2018-01-25 | Disposition: A | Discharge status: Home / Self Care | Payer: 59 | Source: Ambulatory Visit | Attending: Orthopedic Surgery | Admitting: Orthopedic Surgery

## 2018-01-25 DIAGNOSIS — Z01818 Encounter for other preprocedural examination: Secondary | ICD-10-CM | POA: Insufficient documentation

## 2018-01-25 DIAGNOSIS — Z01810 Encounter for preprocedural cardiovascular examination: Secondary | ICD-10-CM | POA: Insufficient documentation

## 2018-01-25 DIAGNOSIS — Z01812 Encounter for preprocedural laboratory examination: Secondary | ICD-10-CM | POA: Insufficient documentation

## 2018-01-25 LAB — CBC WITH DIFFERENTIAL/PLATELET
BASOS ABS: 0 10*3/uL (ref 0.0–0.1)
Basophils Relative: 1 %
EOS ABS: 0.1 10*3/uL (ref 0.0–0.7)
Eosinophils Relative: 2 %
HCT: 45.5 % (ref 39.0–52.0)
Hemoglobin: 15.6 g/dL (ref 13.0–17.0)
LYMPHS ABS: 2.8 10*3/uL (ref 0.7–4.0)
Lymphocytes Relative: 41 %
MCH: 28.7 pg (ref 26.0–34.0)
MCHC: 34.3 g/dL (ref 30.0–36.0)
MCV: 83.6 fL (ref 78.0–100.0)
Monocytes Absolute: 0.5 10*3/uL (ref 0.1–1.0)
Monocytes Relative: 7 %
NEUTROS PCT: 49 %
Neutro Abs: 3.5 10*3/uL (ref 1.7–7.7)
Platelets: 249 10*3/uL (ref 150–400)
RBC: 5.44 MIL/uL (ref 4.22–5.81)
RDW: 12.2 % (ref 11.5–15.5)
WBC: 6.9 10*3/uL (ref 4.0–10.5)

## 2018-01-25 LAB — COMPREHENSIVE METABOLIC PANEL
ALK PHOS: 53 U/L (ref 38–126)
ALT: 27 U/L (ref 17–63)
AST: 18 U/L (ref 15–41)
Albumin: 4 g/dL (ref 3.5–5.0)
Anion gap: 10 (ref 5–15)
BUN: 10 mg/dL (ref 6–20)
CHLORIDE: 103 mmol/L (ref 101–111)
CO2: 26 mmol/L (ref 22–32)
CREATININE: 0.81 mg/dL (ref 0.61–1.24)
Calcium: 9.3 mg/dL (ref 8.9–10.3)
GFR calc non Af Amer: >60 mL/min (ref >60)
Glucose, Bld: 96 mg/dL (ref 65–99)
Potassium: 4 mmol/L (ref 3.5–5.1)
SODIUM: 139 mmol/L (ref 135–145)
Total Bilirubin: 0.9 mg/dL (ref 0.3–1.2)
Total Protein: 7 g/dL (ref 6.5–8.1)

## 2018-01-25 LAB — URINALYSIS, ROUTINE W REFLEX MICROSCOPIC
Bilirubin Urine: NEGATIVE
GLUCOSE, UA: NEGATIVE mg/dL
Hgb urine dipstick: NEGATIVE
KETONES UR: NEGATIVE mg/dL
LEUKOCYTES UA: NEGATIVE
NITRITE: NEGATIVE
PROTEIN: NEGATIVE mg/dL
Specific Gravity, Urine: 1.008 (ref 1.005–1.030)
pH: 6 (ref 5.0–8.0)

## 2018-01-25 LAB — ABO/RH: ABO/RH(D): O NEG

## 2018-01-25 LAB — PROTIME-INR
INR: 1
Prothrombin Time: 13.1 seconds (ref 11.4–15.2)

## 2018-01-25 LAB — TYPE AND SCREEN
ABO/RH(D): O NEG
Antibody Screen: NEGATIVE

## 2018-01-25 LAB — SURGICAL PCR SCREEN
MRSA, PCR: NEGATIVE
Staphylococcus aureus: NEGATIVE

## 2018-01-25 LAB — APTT: aPTT: 29 seconds (ref 24–36)

## 2018-01-25 NOTE — Progress Notes (Addendum)
PCP: none  Pt. Reports allergic to shellfish when ingested. States skin prep using betadine is okay.

## 2018-01-27 ENCOUNTER — Other Ambulatory Visit: Payer: Self-pay | Admitting: Orthopedic Surgery

## 2018-01-27 ENCOUNTER — Ambulatory Visit
Admission: RE | Admit: 2018-01-27 | Discharge: 2018-01-27 | Disposition: A | Discharge status: Home / Self Care | Payer: 59 | Source: Ambulatory Visit | Attending: Orthopedic Surgery | Admitting: Orthopedic Surgery

## 2018-01-27 DIAGNOSIS — M19012 Primary osteoarthritis, left shoulder: Secondary | ICD-10-CM | POA: Diagnosis not present

## 2018-01-27 DIAGNOSIS — S4992XA Unspecified injury of left shoulder and upper arm, initial encounter: Secondary | ICD-10-CM

## 2018-01-27 DIAGNOSIS — Z01818 Encounter for other preprocedural examination: Secondary | ICD-10-CM | POA: Diagnosis not present

## 2018-01-27 MED ORDER — DEXTROSE 5 % IV SOLN
3.0000 g | INTRAVENOUS | Status: AC
  Administered 2018-01-28: 3 g via INTRAVENOUS
  Filled 2018-01-27: qty 3

## 2018-01-28 ENCOUNTER — Inpatient Hospital Stay (HOSPITAL_COMMUNITY): Payer: 59 | Admitting: Certified Registered Nurse Anesthetist

## 2018-01-28 ENCOUNTER — Ambulatory Visit (HOSPITAL_COMMUNITY): Payer: 59

## 2018-01-28 ENCOUNTER — Encounter (HOSPITAL_COMMUNITY)
Admission: RE | Disposition: A | Discharge status: Home / Self Care | Payer: Self-pay | Source: Ambulatory Visit | Attending: Orthopedic Surgery

## 2018-01-28 ENCOUNTER — Ambulatory Visit (HOSPITAL_COMMUNITY)
Admission: RE | Admit: 2018-01-28 | Discharge: 2018-01-28 | Disposition: A | Discharge status: Home / Self Care | Payer: 59 | Source: Ambulatory Visit | Attending: Orthopedic Surgery | Admitting: Orthopedic Surgery

## 2018-01-28 DIAGNOSIS — T148XXA Other injury of unspecified body region, initial encounter: Secondary | ICD-10-CM | POA: Diagnosis present

## 2018-01-28 DIAGNOSIS — L910 Hypertrophic scar: Secondary | ICD-10-CM | POA: Diagnosis not present

## 2018-01-28 DIAGNOSIS — Z91013 Allergy to seafood: Secondary | ICD-10-CM | POA: Insufficient documentation

## 2018-01-28 DIAGNOSIS — Z96612 Presence of left artificial shoulder joint: Secondary | ICD-10-CM | POA: Diagnosis not present

## 2018-01-28 DIAGNOSIS — S42292A Other displaced fracture of upper end of left humerus, initial encounter for closed fracture: Secondary | ICD-10-CM | POA: Insufficient documentation

## 2018-01-28 DIAGNOSIS — Z6841 Body Mass Index (BMI) 40.0 and over, adult: Secondary | ICD-10-CM | POA: Diagnosis not present

## 2018-01-28 DIAGNOSIS — Z471 Aftercare following joint replacement surgery: Secondary | ICD-10-CM | POA: Diagnosis not present

## 2018-01-28 DIAGNOSIS — M87212 Osteonecrosis due to previous trauma, left shoulder: Secondary | ICD-10-CM | POA: Diagnosis not present

## 2018-01-28 DIAGNOSIS — G8918 Other acute postprocedural pain: Secondary | ICD-10-CM | POA: Diagnosis not present

## 2018-01-28 DIAGNOSIS — Z833 Family history of diabetes mellitus: Secondary | ICD-10-CM | POA: Diagnosis not present

## 2018-01-28 DIAGNOSIS — X58XXXA Exposure to other specified factors, initial encounter: Secondary | ICD-10-CM | POA: Insufficient documentation

## 2018-01-28 DIAGNOSIS — T84191A Other mechanical complication of internal fixation device of left humerus, initial encounter: Secondary | ICD-10-CM | POA: Diagnosis not present

## 2018-01-28 DIAGNOSIS — Z809 Family history of malignant neoplasm, unspecified: Secondary | ICD-10-CM | POA: Diagnosis not present

## 2018-01-28 DIAGNOSIS — T84098A Other mechanical complication of other internal joint prosthesis, initial encounter: Secondary | ICD-10-CM | POA: Diagnosis not present

## 2018-01-28 HISTORY — PX: SHOULDER HEMI-ARTHROPLASTY: SHX5049

## 2018-01-28 SURGERY — HEMIARTHROPLASTY, SHOULDER
Anesthesia: Regional | Site: Shoulder | Laterality: Left

## 2018-01-28 MED ORDER — FENTANYL CITRATE (PF) 100 MCG/2ML IJ SOLN: 25.0000 ug | INTRAMUSCULAR | Status: DC | PRN

## 2018-01-28 MED ORDER — LIDOCAINE HCL (CARDIAC) PF 100 MG/5ML IV SOSY
PREFILLED_SYRINGE | INTRAVENOUS | Status: DC | PRN
  Administered 2018-01-28: 100 mg via INTRAVENOUS

## 2018-01-28 MED ORDER — SUGAMMADEX SODIUM 500 MG/5ML IV SOLN
INTRAVENOUS | Status: AC
Start: 2018-01-28 — End: ?
  Filled 2018-01-28: qty 5

## 2018-01-28 MED ORDER — PHENYLEPHRINE 40 MCG/ML (10ML) SYRINGE FOR IV PUSH (FOR BLOOD PRESSURE SUPPORT)
PREFILLED_SYRINGE | INTRAVENOUS | Status: DC | PRN
  Administered 2018-01-28 (×6): 80 ug via INTRAVENOUS

## 2018-01-28 MED ORDER — SUCCINYLCHOLINE CHLORIDE 200 MG/10ML IV SOSY
PREFILLED_SYRINGE | INTRAVENOUS | Status: DC | PRN
  Administered 2018-01-28: 180 mg via INTRAVENOUS

## 2018-01-28 MED ORDER — PROPOFOL 10 MG/ML IV BOLUS
INTRAVENOUS | Status: DC | PRN
  Administered 2018-01-28: 200 mg via INTRAVENOUS

## 2018-01-28 MED ORDER — DEXAMETHASONE SODIUM PHOSPHATE 10 MG/ML IJ SOLN
INTRAMUSCULAR | Status: DC | PRN
  Administered 2018-01-28: 10 mg via INTRAVENOUS

## 2018-01-28 MED ORDER — ONDANSETRON HCL 4 MG/2ML IJ SOLN: 4.0000 mg | Freq: Once | INTRAMUSCULAR | Status: DC | PRN

## 2018-01-28 MED ORDER — ONDANSETRON HCL 4 MG/2ML IJ SOLN
INTRAMUSCULAR | Status: AC
  Filled 2018-01-28: qty 2

## 2018-01-28 MED ORDER — SODIUM CHLORIDE 0.9 % IR SOLN
Status: DC | PRN
  Administered 2018-01-28: 3000 mL

## 2018-01-28 MED ORDER — MIDAZOLAM HCL 2 MG/2ML IJ SOLN
INTRAMUSCULAR | Status: AC
  Filled 2018-01-28: qty 2

## 2018-01-28 MED ORDER — SUGAMMADEX SODIUM 500 MG/5ML IV SOLN
INTRAVENOUS | Status: DC | PRN
  Administered 2018-01-28: 350 mg via INTRAVENOUS

## 2018-01-28 MED ORDER — ROCURONIUM BROMIDE 50 MG/5ML IV SOLN
INTRAVENOUS | Status: AC
  Filled 2018-01-28: qty 2

## 2018-01-28 MED ORDER — ONDANSETRON HCL 4 MG/2ML IJ SOLN
INTRAMUSCULAR | Status: DC | PRN
  Administered 2018-01-28: 4 mg via INTRAVENOUS

## 2018-01-28 MED ORDER — LACTATED RINGERS IV SOLN
INTRAVENOUS | Status: DC | PRN
  Administered 2018-01-28 (×2): via INTRAVENOUS

## 2018-01-28 MED ORDER — ROPIVACAINE HCL 7.5 MG/ML IJ SOLN
INTRAMUSCULAR | Status: DC | PRN
  Administered 2018-01-28: 30 mL via PERINEURAL

## 2018-01-28 MED ORDER — LIDOCAINE 2% (20 MG/ML) 5 ML SYRINGE
INTRAMUSCULAR | Status: AC
  Filled 2018-01-28: qty 5

## 2018-01-28 MED ORDER — 0.9 % SODIUM CHLORIDE (POUR BTL) OPTIME
TOPICAL | Status: DC | PRN
  Administered 2018-01-28: 1000 mL

## 2018-01-28 MED ORDER — DEXAMETHASONE SODIUM PHOSPHATE 10 MG/ML IJ SOLN
INTRAMUSCULAR | Status: AC
  Filled 2018-01-28: qty 1

## 2018-01-28 MED ORDER — TRANEXAMIC ACID 1000 MG/10ML IV SOLN
1000.0000 mg | INTRAVENOUS | Status: AC
  Administered 2018-01-28: 1000 mg via INTRAVENOUS
  Filled 2018-01-28: qty 1100

## 2018-01-28 MED ORDER — MIDAZOLAM HCL 5 MG/5ML IJ SOLN
INTRAMUSCULAR | Status: DC | PRN
  Administered 2018-01-28 (×2): 1 mg via INTRAVENOUS

## 2018-01-28 MED ORDER — DEXMEDETOMIDINE HCL IN NACL 200 MCG/50ML IV SOLN
INTRAVENOUS | Status: DC | PRN
  Administered 2018-01-28 (×3): 8 ug via INTRAVENOUS

## 2018-01-28 MED ORDER — FENTANYL CITRATE (PF) 250 MCG/5ML IJ SOLN
INTRAMUSCULAR | Status: AC
  Filled 2018-01-28: qty 5

## 2018-01-28 MED ORDER — METHOCARBAMOL 500 MG PO TABS: 500.0000 mg | ORAL_TABLET | Freq: Three times a day (TID) | ORAL | 1 refills | Status: DC

## 2018-01-28 MED ORDER — SUCCINYLCHOLINE CHLORIDE 200 MG/10ML IV SOSY
PREFILLED_SYRINGE | INTRAVENOUS | Status: AC
  Filled 2018-01-28: qty 10

## 2018-01-28 MED ORDER — PROPOFOL 10 MG/ML IV BOLUS
INTRAVENOUS | Status: AC
  Filled 2018-01-28: qty 40

## 2018-01-28 MED ORDER — CHLORHEXIDINE GLUCONATE 4 % EX LIQD: 60.0000 mL | Freq: Once | CUTANEOUS | Status: DC

## 2018-01-28 MED ORDER — OXYCODONE-ACETAMINOPHEN 5-325 MG PO TABS: 1.0000 | ORAL_TABLET | ORAL | 0 refills | Status: DC | PRN

## 2018-01-28 MED ORDER — POVIDONE-IODINE 7.5 % EX SOLN
Freq: Once | CUTANEOUS | Status: DC
  Filled 2018-01-28: qty 118

## 2018-01-28 MED ORDER — FENTANYL CITRATE (PF) 100 MCG/2ML IJ SOLN
INTRAMUSCULAR | Status: DC | PRN
  Administered 2018-01-28 (×2): 25 ug via INTRAVENOUS
  Administered 2018-01-28: 50 ug via INTRAVENOUS

## 2018-01-28 MED ORDER — ROCURONIUM BROMIDE 100 MG/10ML IV SOLN
INTRAVENOUS | Status: DC | PRN
  Administered 2018-01-28: 20 mg via INTRAVENOUS
  Administered 2018-01-28: 50 mg via INTRAVENOUS

## 2018-01-28 SURGICAL SUPPLY — 76 items
BLADE SAW SAG 73X25 THK (BLADE)
BLADE SAW SGTL 73X25 THK (BLADE) IMPLANT
BLADE SURG 15 STRL LF DISP TIS (BLADE) ×2 IMPLANT
BLADE SURG 15 STRL SS (BLADE) ×4
BOWL SMART MIX CTS (DISPOSABLE) IMPLANT
CHLORAPREP W/TINT 26ML (MISCELLANEOUS) ×6 IMPLANT
CLOSURE WOUND 1/2 X4 (GAUZE/BANDAGES/DRESSINGS)
COVER SURGICAL LIGHT HANDLE (MISCELLANEOUS) ×3 IMPLANT
DRAPE IMP U-DRAPE 54X76 (DRAPES) ×6 IMPLANT
DRAPE INCISE IOBAN 66X45 STRL (DRAPES) ×3 IMPLANT
DRAPE ORTHO SPLIT 77X108 STRL (DRAPES) ×4
DRAPE SURG 17X23 STRL (DRAPES) ×3 IMPLANT
DRAPE SURG ORHT 6 SPLT 77X108 (DRAPES) ×2 IMPLANT
DRAPE U-SHAPE 47X51 STRL (DRAPES) ×3 IMPLANT
DRSG AQUACEL AG ADV 3.5X10 (GAUZE/BANDAGES/DRESSINGS) ×3 IMPLANT
DRSG MEPILEX BORDER 4X4 (GAUZE/BANDAGES/DRESSINGS) IMPLANT
DRSG MEPILEX BORDER 4X8 (GAUZE/BANDAGES/DRESSINGS) IMPLANT
DRSG PAD ABDOMINAL 8X10 ST (GAUZE/BANDAGES/DRESSINGS) IMPLANT
ELECT BLADE 4.0 EZ CLEAN MEGAD (MISCELLANEOUS) ×3
ELECT CAUTERY BLADE 6.4 (BLADE) ×6 IMPLANT
ELECT REM PT RETURN 9FT ADLT (ELECTROSURGICAL) ×3
ELECTRODE BLDE 4.0 EZ CLN MEGD (MISCELLANEOUS) ×1 IMPLANT
ELECTRODE REM PT RTRN 9FT ADLT (ELECTROSURGICAL) ×1 IMPLANT
EVACUATOR 1/8 PVC DRAIN (DRAIN) IMPLANT
GAUZE SPONGE 4X4 12PLY STRL (GAUZE/BANDAGES/DRESSINGS) IMPLANT
GAUZE XEROFORM 1X8 LF (GAUZE/BANDAGES/DRESSINGS) ×3 IMPLANT
GLOVE BIO SURGEON STRL SZ7 (GLOVE) ×3 IMPLANT
GLOVE BIO SURGEON STRL SZ7.5 (GLOVE) ×3 IMPLANT
GLOVE BIOGEL PI IND STRL 7.0 (GLOVE) ×1 IMPLANT
GLOVE BIOGEL PI IND STRL 8 (GLOVE) ×1 IMPLANT
GLOVE BIOGEL PI INDICATOR 7.0 (GLOVE) ×2
GLOVE BIOGEL PI INDICATOR 8 (GLOVE) ×2
GLOVE SURG SS PI 6.5 STRL IVOR (GLOVE) ×3 IMPLANT
GLOVE SURG SS PI 8.0 STRL IVOR (GLOVE) ×3 IMPLANT
GOWN STRL REUS W/ TWL LRG LVL3 (GOWN DISPOSABLE) ×3 IMPLANT
GOWN STRL REUS W/TWL LRG LVL3 (GOWN DISPOSABLE) ×6
HANDPIECE INTERPULSE COAX TIP (DISPOSABLE) ×2
HEAD HUMERAL AEQUALIS 50X16 (Head) ×1 IMPLANT
HEMOSTAT SURGICEL 2X14 (HEMOSTASIS) IMPLANT
HOOD PEEL AWAY FACE SHEILD DIS (HOOD) ×6 IMPLANT
HUMERAL HEAD AEQUALIS 50X16 (Head) ×3 IMPLANT
KIT BASIN OR (CUSTOM PROCEDURE TRAY) ×3 IMPLANT
KIT TURNOVER KIT B (KITS) ×3 IMPLANT
MANIFOLD NEPTUNE II (INSTRUMENTS) ×3 IMPLANT
NEEDLE HYPO 25GX1X1/2 BEV (NEEDLE) IMPLANT
NEEDLE MAYO TROCAR (NEEDLE) ×3 IMPLANT
NS IRRIG 1000ML POUR BTL (IV SOLUTION) ×3 IMPLANT
PACK SHOULDER (CUSTOM PROCEDURE TRAY) ×3 IMPLANT
PACK UNIVERSAL I (CUSTOM PROCEDURE TRAY) ×3 IMPLANT
PAD ARMBOARD 7.5X6 YLW CONV (MISCELLANEOUS) ×6 IMPLANT
PENCIL BUTTON BLDE SNGL 10FT (ELECTRODE) ×3 IMPLANT
RETRIEVER SUT HEWSON (MISCELLANEOUS) IMPLANT
SET HNDPC FAN SPRY TIP SCT (DISPOSABLE) ×1 IMPLANT
SLING ARM FOAM STRAP XLG (SOFTGOODS) ×3 IMPLANT
SLING ARM IMMOBILIZER LRG (SOFTGOODS) IMPLANT
SLING ARM IMMOBILIZER MED (SOFTGOODS) IMPLANT
SPONGE LAP 18X18 X RAY DECT (DISPOSABLE) ×3 IMPLANT
STEM HUMERAL AEQUALIS 70XS2C (Stem) ×3 IMPLANT
STRIP CLOSURE SKIN 1/2X4 (GAUZE/BANDAGES/DRESSINGS) IMPLANT
SUCTION FRAZIER HANDLE 10FR (MISCELLANEOUS) ×2
SUCTION TUBE FRAZIER 10FR DISP (MISCELLANEOUS) ×1 IMPLANT
SUPPORT WRAP ARM LG (MISCELLANEOUS) ×3 IMPLANT
SUT ETHIBOND NAB CT1 #1 30IN (SUTURE) ×9 IMPLANT
SUT FIBERWIRE #2 38 T-5 BLUE (SUTURE)
SUT MNCRL AB 4-0 PS2 18 (SUTURE) ×3 IMPLANT
SUT VIC AB 0 CTB1 27 (SUTURE) ×6 IMPLANT
SUT VIC AB 2-0 CT1 27 (SUTURE) ×4
SUT VIC AB 2-0 CT1 TAPERPNT 27 (SUTURE) ×2 IMPLANT
SUTURE FIBERWR #2 38 T-5 BLUE (SUTURE) IMPLANT
SYR CONTROL 10ML LL (SYRINGE) IMPLANT
TAPE FIBER 2MM 7IN #2 BLUE (SUTURE) IMPLANT
TAPE LABRALWHITE 1.5X36 (TAPE) ×6 IMPLANT
TOWEL OR 17X24 6PK STRL BLUE (TOWEL DISPOSABLE) ×3 IMPLANT
TOWEL OR 17X26 10 PK STRL BLUE (TOWEL DISPOSABLE) ×3 IMPLANT
TRAY FOLEY MTR SLVR 16FR STAT (SET/KITS/TRAYS/PACK) IMPLANT
WATER STERILE IRR 1000ML POUR (IV SOLUTION) IMPLANT

## 2018-01-28 NOTE — Discharge Instructions (Signed)

## 2018-01-28 NOTE — H&P (Signed)
Dylan Rose is an 27 y.o. male.   Chief Complaint: L shoulder pain and dysfunction HPI: s/p severe L shoulder injury s/p ORIF with some incongruity of the humeral head and continued pain and mechanical symptoms.  Past Medical History:  Diagnosis Date  . Medical history non-contributory     Past Surgical History:  Procedure Laterality Date  . ELBOW FRACTURE SURGERY Left 2005  . FRACTURE SURGERY    . ORIF HUMERUS FRACTURE Left 06/30/2017   Procedure: OPEN REDUCTION INTERNAL FIXATION (ORIF) PROXIMAL HUMERUS FRACTURE;  Surgeon: Yolonda Kida, MD;  Location: [facility] OR;  Service: Orthopedics;  Laterality: Left;  180 mins  . WISDOM TOOTH EXTRACTION      Family History  Problem Relation Age of Onset  . Diabetes Father   . Cancer Maternal Grandfather   . Cancer Paternal Grandfather   . Diabetes Paternal Grandfather    Social History:  reports that he has never smoked. He has never used smokeless tobacco. He reports that he drinks about 1.2 oz of alcohol per week. He reports that he does not use drugs.  Allergies:  Allergies  Allergen Reactions  . Shellfish Allergy Swelling    Facial swelling    No medications prior to admission.    No results found for this or any previous visit (from the past 48 hour(s)). Ct Shoulder Left Wo Contrast  Result Date: 01/27/2018 CLINICAL DATA:  Preop left shoulder replacement 01/28/2018 EXAM: CT OF THE UPPER LEFT EXTREMITY WITHOUT CONTRAST TECHNIQUE: Multidetector CT imaging of the upper left extremity was performed according to the standard protocol. COMPARISON:  None. FINDINGS: Bones/Joint/Cartilage No acute fracture or dislocation. Old healed comminuted left proximal humeral fracture transfixed with a sideplate and multiple interlocking screws. Subchondral sclerosis along the superior aspect of the humeral head most concerning for avascular necrosis. Moderate glenohumeral joint space narrowing. No acute fracture or dislocation. No loss of  glenoid bone stock. Normal acromion humeral distance. Type I acromion. No aggressive osseous lesion. Ligaments Suboptimally assessed by CT. Muscles and Tendons Muscles are normal. No muscle atrophy. Soft tissues No fluid collection or hematoma. No soft tissue mass. Visualized left lung is clear. IMPRESSION: 1. No acute osseous injury of left shoulder. 2. Old healed comminuted left proximal humeral fracture transfixed with a sideplate and multiple interlocking screws. Subchondral sclerosis along the superior aspect of the humeral head most concerning for avascular necrosis. Moderate glenohumeral joint space narrowing. Electronically Signed   By: Elige Ko   On: 01/27/2018 14:45    Review of Systems  All other systems reviewed and are negative.   Blood pressure (!) 158/92, pulse 76, temperature 98.1 F (36.7 C), temperature source Oral, resp. rate 20, weight (!) 180.1 kg (397 lb), SpO2 98 %. Physical Exam  Constitutional: He is oriented to person, place, and time. He appears well-developed and well-nourished.  HENT:  Head: Atraumatic.  Eyes: EOM are normal.  Cardiovascular: Intact distal pulses.  Respiratory: Effort normal.  Musculoskeletal:  L shoulder pain with limited ROM. NVID.  Neurological: He is alert and oriented to person, place, and time.  Skin: Skin is warm and dry.  Psychiatric: He has a normal mood and affect.     Assessment/Plan s/p severe L shoulder injury s/p ORIF with some incongruity of the humeral head and continued pain and mechanical symptoms. Plan L shoulder HWR and hemiarthroplasty Risks / benefits of surgery discussed Consent on chart  NPO for OR Preop antibiotics     [name], MD  01/28/2018, 7:21 AM

## 2018-01-28 NOTE — Transfer of Care (Signed)
Immediate Anesthesia Transfer of Care Note  Patient: Dylan Rose  Procedure(s) Performed: LEFT SHOULDER HARDWARE REMOVAL AND HEMIARTHROPLASTY (Left Shoulder)  Patient Location: PACU  Anesthesia Type:GA combined with regional for post-op pain  Level of Consciousness: awake, alert  and oriented  Airway & Oxygen Therapy: Patient Spontanous Breathing and Patient connected to face mask oxygen  Post-op Assessment: Report given to RN and Post -op Vital signs reviewed and stable  Post vital signs: Reviewed and stable  Last Vitals:  Vitals Value Taken Time  BP    Temp    Pulse 91 01/28/2018 10:30 AM  Resp 22 01/28/2018 10:30 AM  SpO2 96 % 01/28/2018 10:30 AM  Vitals shown include unvalidated device data.  Last Pain:  Vitals:   01/28/18 0620  TempSrc:   PainSc: 3       Patients Stated Pain Goal: 7 (01/28/18 1610)  Complications: No apparent anesthesia complications

## 2018-01-28 NOTE — Anesthesia Postprocedure Evaluation (Signed)
Anesthesia Post Note  Patient: Dylan Rose  Procedure(s) Performed: LEFT SHOULDER HARDWARE REMOVAL AND HEMIARTHROPLASTY (Left Shoulder)     Patient location during evaluation: PACU Anesthesia Type: Regional and General Level of consciousness: awake and alert Pain management: pain level controlled Vital Signs Assessment: post-procedure vital signs reviewed and stable Respiratory status: spontaneous breathing, nonlabored ventilation, respiratory function stable and patient connected to nasal cannula oxygen Cardiovascular status: blood pressure returned to baseline and stable Postop Assessment: no apparent nausea or vomiting Anesthetic complications: no    Last Vitals:  Vitals:   01/28/18 1115 01/28/18 1127  BP: 124/83 120/66  Pulse: 87 87  Resp: 17 20  Temp:  36.9 C  SpO2: 94% 95%    Last Pain:  Vitals:   01/28/18 1127  TempSrc:   PainSc: 0-No pain                 July Linam P Minka Knight

## 2018-01-28 NOTE — Op Note (Signed)
Procedure(s):  Procedure Note  Dylan Rose IV male 27 y.o. 01/28/2018  Procedure(s) and Anesthesia Type: #1 LEFT SHOULDER HARDWARE REMOVAL  #2 LEFT SHOULDER HEMIARTHROPLASTY #3 LEFT SHOULDER EXCISION SCAR 20CM  Surgeon(s) and Role:    Jones Broom, MD - Primary   Indications:  27 y.o. male status post left shoulder fracture dislocation 7 months ago with incongruency of the humeral head and early avascular changes in the head.  He had significant loss of motion with mechanical symptoms and ongoing pain.  Ultimately indicated for surgical management to try and restore congruency to the joint, decrease pain and increase function.  Indicated for hemiarthroplasty.  He also was noted to have a hypertrophic scar from his previous incision.     Surgeon: [name]   Assistants: Damita Lack PA-C [facility] was present and scrubbed throughout the procedure and was essential in positioning, retraction, exposure, and closure)  Anesthesia: General endotracheal anesthesia with preoperative interscalene block given by the attending anesthesiologist    Procedure Detail    Findings: The hypertrophic scar was excised .all deep hardware was removed .Tornier flex anatomic press-fit size 2 stem with a 50 x 16 high offset head, the hypertrophic scar was excised.  A lesser tuberosity osteotomy was performed and repaired at the conclusion of the procedure.  Estimated Blood Loss:  300 mL         Drains: None   Blood Given: none          Specimens: none        Complications:  * No complications entered in OR log *         Disposition: PACU - hemodynamically stable.         Condition: stable    Procedure:   The patient was identified in the preoperative holding area where I personally marked the operative extremity after verifying with the patient and consent. He  was taken to the operating room where He was transferred to the   operative table.  The patient  received an interscalene block in   the holding area by the attending anesthesiologist.  General anesthesia was induced   in the operating room without complication.  The patient did receive IV  Ancef prior to the commencement of the procedure.  The patient was   placed in the beach-chair position with the back raised about 30   degrees.  The nonoperative extremity and head and neck were carefully   positioned and padded protecting against neurovascular compromise.  The   left upper extremity was then prepped and draped in the standard sterile   fashion.    The appropriate operative time-out was performed with   Anesthesia, the perioperative staff, as well as myself and we all agreed   that the left side was the correct operative site. The patient received 1 g IV tranexamic acid at the start of the case around time of the incision. The previous incision was marked out and outlined to excise the segment.  The central segment was proximately 2 cm in width.  He was felt to have adequate soft tissue to have closure without any tension once excised.  Skin was excised sharply on both sides of the hypertrophic scar and dissection was carried down beneath it.  The scar was removed completely.  The interval between the pectoralis major and deltoid was identified and carefully developed.  The deltoid was taken laterally. The pectoralis major was taken medially.   The normal anatomic planes were  carefully developed.   Digital palpation was used to prove   integrity of the axillary nerve which was protected throughout the   procedure.  Musculocutaneous nerve was not palpated in the operative   field.  Conjoined tendon was then retracted gently medially and the   deltoid laterally.  The proximal humeral plate was identified and cleaned of soft tissues.  The anterior screw through the lesser tuberosity was identified and removed without difficulty.  All of the screws and the plate were then removed without  significant difficulty. The bicipital groove was then identified and freed of soft tissues.  The rotator interval was opened.  .  An osteotomy was performed at the lesser tuberosity.  The capsule was then   released all the way down to the 6 o'clock position of the humeral head.  Great care was taken to stay on bone protecting neurovascular structures.  There is a significant amount of scar tissue within the joint.  Progressive external rotation allowed exposure of the native joint.  The humeral head was then delivered with simultaneous adduction,   extension and external rotation.   the anatomic neck of the humerus was estimated despite the missing anterior 40% of the articular bone and cut free hand at   approximately 25 degrees retroversion within about 3 mm of the cuff   reflection posteriorly.  The head size was estimated to be a 50 medium   offset.   The entry awl was used followed by sounding reamers and then sequentially broached from size 1-2.  Trial head was placed with a 50 low offset.  With the trial implantation of the component, there was appropriate soft tissue tension.   The trial was removed and the final implant was prepared on a back table.  The trial was removed and the final implant was prepared on a back table.   3 small holes were drilled on the medial side of the lesser tuberosity osteotomy, through which 2 labral tapes were passed. The implant was then placed through the loop of the 2 labral tapes and impacted with an excellent press-fit. This achieved excellent anatomic reconstruction of the proximal humerus.  The joint was then copiously irrigated with pulse lavage.  The subscapularis and   lesser tuberosity osteotomy were then repaired using the 2 labral tapes previously passed in a double row fashion with horizontal mattress sutures medially brought over through bone tunnels tied over a bone bridge laterally.   One #1 Ethibond was placed at the rotator interval just above    the lesser tuberosity. Copious irrigation was used. Skin was closed with 2-0 Vicryl sutures in the deep dermal layer and staples for skin closure.  Sterile dressings were then applied including Aquacel.  The patient was placed in a sling and allowed to awaken from general anesthesia and taken to the recovery room in stable condition.      POSTOPERATIVE PLAN:  Early passive range of motion will be allowed with the goal of 0 degrees external rotation and 90 degrees forward elevation.  No internal rotation at this time.  No active motion of the arm until the lesser tuberosity heals.  The patient and his wife are both interested in going home today if he is stable in the recovery room.  I think if he is hemodynamically stable and pain is well controlled this is a reasonable option for him pending the family's wishes.  If there is any concern we will keep him overnight.

## 2018-01-28 NOTE — Anesthesia Procedure Notes (Signed)
Anesthesia Regional Block: Interscalene brachial plexus block   Pre-Anesthetic Checklist: ,, timeout performed, Correct Patient, Correct Site, Correct Laterality, Correct Procedure,, site marked, risks and benefits discussed, Surgical consent,  Pre-op evaluation,  At surgeon's request and post-op pain management  Laterality: Left  Prep: chloraprep       Needles:  Injection technique: Single-shot  Needle Type: Echogenic Stimulator Needle     Needle Length: 9cm  Needle Gauge: 21     Additional Needles:   Procedures:,,,, ultrasound used (permanent image in chart),,,,  Narrative:  Start time: 01/28/2018 7:10 AM End time: 01/28/2018 7:20 AM Injection made incrementally with aspirations every 5 mL.  Performed by: Personally  Anesthesiologist: Leonides Grills, MD  Additional Notes: Functioning IV was confirmed and monitors were applied.  A 90mm 21ga Arrow echogenic stimulator needle was used. Sterile prep, hand hygiene and sterile gloves were used.  Negative aspiration and negative test dose prior to incremental administration of local anesthetic. The patient tolerated the procedure well.

## 2018-01-28 NOTE — Anesthesia Procedure Notes (Signed)
Procedure Name: Intubation Date/Time: 01/28/2018 7:47 AM Performed by: Jed Limerick, CRNA Pre-anesthesia Checklist: Patient identified, Emergency Drugs available, Suction available and Patient being monitored Patient Re-evaluated:Patient Re-evaluated prior to induction Oxygen Delivery Method: Circle System Utilized Preoxygenation: Pre-oxygenation with 100% oxygen Induction Type: IV induction Laryngoscope Size: Glidescope and 4 Grade View: Grade I Tube type: Oral Tube size: 7.5 mm Number of attempts: 1 Airway Equipment and Method: Stylet Placement Confirmation: ETT inserted through vocal cords under direct vision,  positive ETCO2 and breath sounds checked- equal and bilateral Secured at: 23 cm Tube secured with: Tape Dental Injury: Teeth and Oropharynx as per pre-operative assessment  Difficulty Due To: Difficulty was anticipated Future Recommendations: Recommend- induction with short-acting agent, and alternative techniques readily available

## 2018-01-28 NOTE — Anesthesia Preprocedure Evaluation (Addendum)
Anesthesia Evaluation  Patient identified by MRN, date of birth, ID band Patient awake    Reviewed: Allergy & Precautions, NPO status , Patient's Chart, lab work & pertinent test results  Airway Mallampati: II  TM Distance: >3 FB Neck ROM: Full    Dental no notable dental hx.    Pulmonary neg pulmonary ROS,    Pulmonary exam normal breath sounds clear to auscultation       Cardiovascular negative cardio ROS Normal cardiovascular exam Rhythm:Regular Rate:Normal  ECG: NSR, rate 71   Neuro/Psych negative neurological ROS  negative psych ROS   GI/Hepatic negative GI ROS, Neg liver ROS,   Endo/Other  Morbid obesitySuper obese  Renal/GU negative Renal ROS     Musculoskeletal negative musculoskeletal ROS (+)   Abdominal (+) + obese,   Peds  Hematology negative hematology ROS (+)   Anesthesia Other Findings LEFT SHOULDER FAILED ORIF, OSTEOARTHRITIS  Reproductive/Obstetrics                            Anesthesia Physical Anesthesia Plan  ASA: IV  Anesthesia Plan: General and Regional   Post-op Pain Management: GA combined w/ Regional for post-op pain   Induction: Intravenous  PONV Risk Score and Plan: 2 and Ondansetron, Dexamethasone, Midazolam and Treatment may vary due to age or medical condition  Airway Management Planned: Oral ETT  Additional Equipment:   Intra-op Plan:   Post-operative Plan: Extubation in OR  Informed Consent: I have reviewed the patients History and Physical, chart, labs and discussed the procedure including the risks, benefits and alternatives for the proposed anesthesia with the patient or authorized representative who has indicated his/her understanding and acceptance.   Dental advisory given  Plan Discussed with: CRNA  Anesthesia Plan Comments:         Anesthesia Quick Evaluation

## 2018-01-29 ENCOUNTER — Encounter (HOSPITAL_COMMUNITY): Payer: Self-pay | Admitting: Orthopedic Surgery

## 2018-02-01 ENCOUNTER — Other Ambulatory Visit: Payer: Self-pay | Admitting: *Deleted

## 2018-02-01 NOTE — Patient Outreach (Addendum)
[facility]) Care Management  02/01/2018  Dylan Rose December 13, 1990 [id number]  Subjective:   Telephone call to patient's home  / mobile number, no answer, left HIPAA compliant voicemail message, and requested call back.     Objective:  Per KPN (Knowledge Performance Now, point of care tool) and chart review, patient hospitalized 01/28/18 -01/28/18 for Left shoulder pain and dysfunction.   Status post LEFT SHOULDER HARDWARE REMOVAL,  LEFT SHOULDER  HEMIARTHROPLASTY, and LEFT SHOULDER EXCISION SCAR 20CM on 01/28/18.     Assessment:  Received UMR Transition of care referral on 01/04/18.   Transition of care follow up pending patient contact.      Plan: RNCM will send unsuccessful outreach  letter, [facility] pamphlet, will call patient for 2nd telephone outreach attempt, transition of care follow up, and proceed with case closure, within 10 business days if no return call.       Ordell Prichett H. Gardiner Barefoot, BSN, CCM [facility] Care Management [facility] Telephonic CM Phone: [phone] Fax: [phone]

## 2018-02-02 ENCOUNTER — Other Ambulatory Visit: Payer: Self-pay | Admitting: *Deleted

## 2018-02-02 NOTE — Patient Outreach (Addendum)
[facility]) Care Management  02/02/2018  Dylan Rose May 01, 1991 [id number]   Subjective: Received voicemail message from Dylan Rose, states he is returning call, and requested call back. Telephone call to patient's home / mobile number, spoke with patient, states legal name is Dylan Rose, and HIPAA verified.  Discussed [facility] Care Management UMR Transition of care follow up, patient voiced understanding, and is in agreement to follow up.    Patient states he is doing well, has returned to work, currently working from home, does not have a primary provider, declines to obtain primary provider at this time, and states he will follow up [facility] as needed.  Discussed importance of hospital follow up with primary MD, patient voices understanding, and states he will follow up as appropriate.  States he has a follow up appointment with surgeon on 02/10/18.   Patient states he is able to manage self care and has assistance as needed with activities of daily living / home management.  Patient voices understanding of medical diagnosis, surgery, and treatment plan.   States he is accessing the following [facility] benefits: outpatient pharmacy (is aware of service, uses local pharmacy if needed due to not having medications to take on a routine basis) and hospital indemnity (not a chosen benefit).     Patient educated on how to obtain services through [facility] pharmacy, voiced understanding, and states he will follow up as appropriate.   Patient states his wife works for [employer] as a Statistician,  is pregnant, and a part of the [employer] / Health Pregnancy program.   Patient states he does not have any Secretary/administrator, transition of care, care coordination, disease management, disease monitoring, transportation, Publishing rights manager, or pharmacy needs at this time.  States he is very appreciative of the follow up and is in agreement to receive [facility] Care  Management information.      Objective:  Per KPN (Knowledge Performance Now, point of care tool) and chart review, patient hospitalized 01/28/18 -01/28/18 for Left shoulder pain and dysfunction.   Status post LEFT SHOULDER HARDWARE REMOVAL, LEFT SHOULDER HEMIARTHROPLASTY, and LEFT SHOULDER EXCISION SCAR 20CM on 01/28/18.     Assessment:  Received UMR Transition of care referral on 01/04/18.   Transition of care follow up completed, no care management needs, and will proceed with case closure.      Plan: RNCM will send patient successful outreach letter, [facility] pamphlet, and magnet. RNCM will complete case closure due to follow up completed / no care management needs.        Eagan Shifflett H. Gardiner Barefoot, BSN, CCM [facility] Care Management [facility] Telephonic CM Phone: [phone] Fax: [phone]

## 2018-02-03 ENCOUNTER — Ambulatory Visit: Payer: 59 | Admitting: *Deleted

## 2018-02-03 DIAGNOSIS — M25512 Pain in left shoulder: Secondary | ICD-10-CM | POA: Diagnosis not present

## 2018-02-03 DIAGNOSIS — Z9889 Other specified postprocedural states: Secondary | ICD-10-CM | POA: Diagnosis not present

## 2018-03-10 DIAGNOSIS — M25512 Pain in left shoulder: Secondary | ICD-10-CM | POA: Diagnosis not present

## 2018-03-18 ENCOUNTER — Other Ambulatory Visit: Payer: Self-pay

## 2018-03-18 ENCOUNTER — Encounter: Payer: Self-pay | Admitting: Physical Therapy

## 2018-03-18 ENCOUNTER — Ambulatory Visit: Payer: 59 | Attending: Orthopedic Surgery | Admitting: Physical Therapy

## 2018-03-18 DIAGNOSIS — M25512 Pain in left shoulder: Secondary | ICD-10-CM | POA: Insufficient documentation

## 2018-03-18 DIAGNOSIS — G8929 Other chronic pain: Secondary | ICD-10-CM | POA: Diagnosis not present

## 2018-03-18 DIAGNOSIS — M25612 Stiffness of left shoulder, not elsewhere classified: Secondary | ICD-10-CM | POA: Diagnosis not present

## 2018-03-18 DIAGNOSIS — M6281 Muscle weakness (generalized): Secondary | ICD-10-CM | POA: Insufficient documentation

## 2018-03-18 NOTE — Therapy (Signed)
[facility] [address] [place], [place], [address] Phone: [phone]   Fax:  [phone]  Physical Therapy Evaluation  Patient Details  Name: Dylan Rose MRN: [id number] Date of Birth: November 19, 1990 Referring Provider: Dr Jones Broom    Encounter Date: 03/18/2018  PT End of Session - 03/18/18 1621    Visit Number  1    Number of Visits  12    Date for PT Re-Evaluation  04/29/18    Authorization Type  [facility] UMR     PT Start Time  1329    PT Stop Time  1414    PT Time Calculation (min)  45 min    Activity Tolerance  Patient tolerated treatment well    Behavior During Therapy  [facility] for tasks assessed/performed       Past Medical History:  Diagnosis Date  . Medical history non-contributory     Past Surgical History:  Procedure Laterality Date  . ELBOW FRACTURE SURGERY Left 2005  . FRACTURE SURGERY    . ORIF HUMERUS FRACTURE Left 06/30/2017   Procedure: OPEN REDUCTION INTERNAL FIXATION (ORIF) PROXIMAL HUMERUS FRACTURE;  Surgeon: Yolonda Kida, MD;  Location: [facility] OR;  Service: Orthopedics;  Laterality: Left;  180 mins  . SHOULDER HEMI-ARTHROPLASTY Left 01/28/2018   Procedure: LEFT SHOULDER HARDWARE REMOVAL AND HEMIARTHROPLASTY;  Surgeon: Jones Broom, MD;  Location: [facility] OR;  Service: Orthopedics;  Laterality: Left;  . WISDOM TOOTH EXTRACTION      There were no vitals filed for this visit.   Subjective Assessment - 03/18/18 1335    Subjective  Patient had a left total shoulder replacement on 01/28/2018. He is out of the sling and has been working on stretching and exercises for the past few weeks. His pain is well controlled.     Limitations  House hold activities    Diagnostic tests  nothing post op     Patient Stated Goals  to have full use of his left shoulder     Currently in Pain?  No/denies Has minor pain from time to time          [facility] PT Assessment - 03/18/18 0001      Assessment   Medical  Diagnosis  Left Shoulder Replacement     Referring Provider  Dr Jones Broom     Onset Date/Surgical Date  01/25/18    Hand Dominance  Right    Next MD Visit  5 weeks     Prior Therapy  Prior to surgery had therapy on his shoulder       Precautions   Precautions  None      Restrictions   Weight Bearing Restrictions  No      Balance Screen   Has the patient fallen in the past 6 months  No    Has the patient had a decrease in activity level because of a fear of falling?   No    Is the patient reluctant to leave their home because of a fear of falling?   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       Cognition   Overall Cognitive Status  Within Functional Limits for tasks assessed  Attention  Focused    Focused Attention  Appears intact    Memory  Appears intact    Awareness  Appears intact    Problem Solving  Appears intact      Sensation   Light Touch  Appears Intact    Additional Comments  denies parathesias       Coordination   Gross Motor Movements are Fluid and Coordinated  Yes    Fine Motor Movements are Fluid and Coordinated  Yes      Posture/Postural Control   Posture/Postural Control  No significant limitations      ROM / Strength   AROM / PROM / Strength  AROM;PROM;Strength      AROM   AROM Assessment Site  Shoulder    Right/Left Shoulder  Right;Left    Right Shoulder Flexion  118 Degrees    Right Shoulder Internal Rotation  -- can reach tro belt line     Right Shoulder External Rotation  -- can reach behind his head but has to move his head forward     Left Shoulder Flexion  118 Degrees    Left Shoulder Internal Rotation  -- can reach to belt line     Left Shoulder External Rotation  -- can reach the back of his head but hads to move his head fro      PROM    PROM Assessment Site  Shoulder    Right/Left Shoulder  Right;Left    Left Shoulder Flexion  140 Degrees    Left Shoulder Internal Rotation  50 Degrees    Left Shoulder External Rotation  50 Degrees      Strength   Strength Assessment Site  Shoulder    Right/Left Shoulder  Right;Left    Left Shoulder Flexion  4+/5    Left Shoulder Internal Rotation  4+/5    Left Shoulder External Rotation  4+/5                Objective measurements completed on examination: See above findings.      [facility] Adult PT Treatment/Exercise - 03/18/18 0001      Shoulder Exercises: Supine   Other Supine Exercises  wand flexion 2x10; wand ER 2x10       Shoulder Exercises: Standing   Extension Limitations  2x10 yellow     Row Limitations  2x10 yellow              PT Education - 03/18/18 1621    Education Details  reviewed protocol an progression of exercises.     Person(s) Educated  Patient    Methods  Demonstration;Tactile cues;Verbal cues;Explanation    Comprehension  Verbalized understanding;Returned demonstration;Verbal cues required;Tactile cues required       PT Short Term Goals - 03/18/18 2128      PT SHORT TERM GOAL #1   Title  Patient will demsotrate 160 degrees of passive shoulder flexion     Time  3    Period  Weeks    Status  New      PT SHORT TERM GOAL #2   Title  Patient will increase passive left shoulder ER to 65 degrees     Time  3    Period  Weeks    Status  New      PT SHORT TERM GOAL #3   Title  Patient will demsotrate 5/5 gross left shoulder flexion     Time  3    Period  Weeks    Status  New        PT Long Term Goals - 03/18/18 2129      PT LONG TERM GOAL #1   Title  Patient will reach into cabinet with 2lb weight in order to perfrom ADL's     Time  6    Period  Weeks    Status  On-going      PT LONG TERM GOAL #2   Title  Patient will reach behind his back to L3 in order to perfrom hygiene tasks     Time  6    Period  Weeks    Status   New      PT LONG TERM GOAL #3   Title  Patient will demostrate 28% limitation on FOTO     Time  6    Period  Weeks    Status  New             Plan - 03/18/18 1622    Clinical Impression Statement  Patient is a 27 year old male S/P left shoulder hemiarthropladsy on 01/28/2018. He presents with expected limitations in range, strength, and function. His inital ROM measurements are much better then before his opertation. He would benefit from funtional strengthening and manual therapy for ROM. He was seen for a low complexity eval.     Clinical Presentation  Stable    Clinical Decision Making  Low    PT Frequency  2x / week    PT Duration  6 weeks    PT Treatment/Interventions  ADLs/Self Care Home Management;Cryotherapy;Electrical Stimulation;Ultrasound;Iontophoresis 4mg /ml Dexamethasone;Moist Heat;Functional mobility training;Therapeutic activities;Therapeutic exercise;Patient/family education;Manual techniques;Passive range of motion;Taping    PT Next Visit Plan  continue with PROM; add active IR and ER; Consider wall alides;     PT Home Exercise Plan  wand flexion and er shoulder extension and scpa retraction     Consulted and Agree with Plan of Care  Patient       Patient will benefit from skilled therapeutic intervention in order to improve the following deficits and impairments:  Pain, Impaired UE functional use, Decreased strength, Decreased range of motion, Decreased endurance, Decreased activity tolerance  Visit Diagnosis: Stiffness of left shoulder, not elsewhere classified  Chronic left shoulder pain  Muscle weakness (generalized)     Problem List Patient Active Problem List   Diagnosis Date Noted  . Closed fracture of left proximal humerus 06/30/2017  . Humeral head fracture, left, closed, initial encounter 06/30/2017    Dessie Comaavid J Emiley Digiacomo PT DPT  03/18/2018, 9:39 PM  [facility] [address] [place], [id number] Phone: [phone]   Fax:  [phone]  Name: Kreg ShropshireCharles Elbert Nienhuis IV MRN: [id number] Date of Birth: 02/18/1991

## 2018-03-18 NOTE — Patient Instructions (Signed)
Patient has pictures at home of exercises

## 2018-03-22 ENCOUNTER — Ambulatory Visit: Payer: 59 | Attending: Orthopedic Surgery | Admitting: Physical Therapy

## 2018-03-22 ENCOUNTER — Encounter: Payer: Self-pay | Admitting: Physical Therapy

## 2018-03-22 DIAGNOSIS — M6281 Muscle weakness (generalized): Secondary | ICD-10-CM | POA: Insufficient documentation

## 2018-03-22 DIAGNOSIS — M25512 Pain in left shoulder: Secondary | ICD-10-CM | POA: Diagnosis not present

## 2018-03-22 DIAGNOSIS — M25612 Stiffness of left shoulder, not elsewhere classified: Secondary | ICD-10-CM | POA: Insufficient documentation

## 2018-03-22 DIAGNOSIS — G8929 Other chronic pain: Secondary | ICD-10-CM | POA: Diagnosis not present

## 2018-03-22 NOTE — Therapy (Signed)
[facility] [address] [place], [place], [address] Phone: [phone]   Fax:  [phone]  Physical Therapy Treatment  Patient Details  Name: Dylan Rose MRN: [id number] Date of Birth: 12/04/1990 Referring Provider: Dr Jones BroomJustin Chandler    Encounter Date: 03/22/2018  PT End of Session - 03/22/18 0834    Visit Number  2    Number of Visits  12    Date for PT Re-Evaluation  04/29/18    Authorization Type  [facility] U[facility]     PT Start Time  0834    PT Stop Time  0917    PT Time Calculation (min)  43 min       Past Medical History:  Diagnosis Date  . Medical history non-contributory     Past Surgical History:  Procedure Laterality Date  . ELBOW FRACTURE SURGERY Left 2005  . FRACTURE SURGERY    . ORIF HUMERUS FRACTURE Left 06/30/2017   Procedure: OPEN REDUCTION INTERNAL FIXATION (ORIF) PROXIMAL HUMERUS FRACTURE;  Surgeon: Yolonda Kidaogers, Jason Patrick, MD;  [facility];  Service: Orthopedics;  Laterality: Left;  180 mins  . SHOULDER HEMI-ARTHROPLASTY Left 01/28/2018   Procedure: LEFT SHOULDER HARDWARE REMOVAL AND HEMIARTHROPLASTY;  Surgeon: Jones Broomhandler, Justin, MD;  [facility];  Service: Orthopedics;  Laterality: Left;  . WISDOM TOOTH EXTRACTION      There were no vitals filed for this visit.  Subjective Assessment - 03/22/18 0835    Subjective  No pain. Doing well with the exercises.     Currently in Pain?  No/denies         [facility] - 03/22/18 0001      PROM   Left Shoulder Flexion  155 Degrees                   [facility] Adult PT Treatment/Exercise - 03/22/18 0001      Shoulder Exercises: Supine   External Rotation  Strengthening;Left;10 reps;Theraband    Theraband Level (Shoulder External Rotation)  Level 1 (Yellow)    Other Supine Exercises  wand flexion 2x10; wand ER 2x10 , horizontal abduction/adduction       Shoulder Exercises: Standing   External Rotation  AAROM;Strengthening yellow     Internal Rotation  AAROM;Strengthening cane , yellow band    Flexion  AAROM cane    ABduction  AAROM cane, scaption    Extension  AAROM cane    Extension Limitations  2x10 red    Row Limitations  2x10 red    Other Standing Exercises  Bicep and tricep red band x 20 each     Other Standing Exercises  Wall slides with sheet x 15      Manual Therapy   Manual Therapy  Passive ROM    Passive ROM  Flexion and ER             PT Education - 03/22/18 0919    Education Details  HEP    Person(s) Educated  Patient    Methods  Explanation;Handout    Comprehension  Verbalized understanding       PT Short Term Goals - 03/18/18 2128      PT SHORT TERM GOAL #1   Title  Patient will demsotrate 160 degrees of passive shoulder flexion     Time  3    Period  Weeks    Status  New      PT SHORT TERM GOAL #2   Title  Patient will increase  passive left shoulder ER to 65 degrees     Time  3    Period  Weeks    Status  New      PT SHORT TERM GOAL #3   Title  Patient will demsotrate 5/5 gross left shoulder flexion     Time  3    Period  Weeks    Status  New        PT Long Term Goals - 03/18/18 2129      PT LONG TERM GOAL #1   Title  Patient will reach into cabinet with 2lb weight in order to perfrom ADL's     Time  6    Period  Weeks    Status  On-going      PT LONG TERM GOAL #2   Title  Patient will reach behind his back to L3 in order to perfrom hygiene tasks     Time  6    Period  Weeks    Status  New      PT LONG TERM GOAL #3   Title  Patient will demostrate 28% limitation on FOTO     Time  6    Period  Weeks    Status  New            Plan - 03/22/18 1610    Clinical Impression Statement  Pt reports no pain and doing well with current HEP. Progessed per [facility] Orthopedic Protocol to Week 6-9. Added IR/ER theraband yellow and red for home as he will not be nack until next week. Also added tricep and bicep strengthening with red theraband. Minimal increase  in pain with ER bands. Cautioned to keep pain at a low level.     PT Next Visit Plan  continue with PROM; add active IR and ER; Consider wall alides; : Protocol: Week 6-9: Add Phase II ROM for extension, cross body and IR: Phase I strength: ER, IR, Extension, Scap strengthening     PT Home Exercise Plan  wand flexion and er shoulder extension and scpa retraction , red vs yelllow band IR/ER, red band tricep and bicep    Consulted and Agree with Plan of Care  Patient       Patient will benefit from skilled therapeutic intervention in order to improve the following deficits and impairments:  Pain, Impaired UE functional use, Decreased strength, Decreased range of motion, Decreased endurance, Decreased activity tolerance  Visit Diagnosis: Stiffness of left shoulder, not elsewhere classified  Chronic left shoulder pain  Muscle weakness (generalized)     Problem List Patient Active Problem List   Diagnosis Date Noted  . Closed fracture of left proximal humerus 06/30/2017  . Humeral head fracture, left, closed, initial encounter 06/30/2017    Sherrie Mustache, PTA  03/22/2018, 9:35 AM  [facility] [address] [place], [place], [address] Phone: [phone]   Fax:  [phone]  Name: Dylan Rose MRN: [id number] Date of Birth: [birth year]

## 2018-03-30 ENCOUNTER — Ambulatory Visit: Payer: 59

## 2018-03-30 DIAGNOSIS — M25612 Stiffness of left shoulder, not elsewhere classified: Secondary | ICD-10-CM | POA: Diagnosis not present

## 2018-03-30 DIAGNOSIS — M6281 Muscle weakness (generalized): Secondary | ICD-10-CM

## 2018-03-30 DIAGNOSIS — G8929 Other chronic pain: Secondary | ICD-10-CM | POA: Diagnosis not present

## 2018-03-30 DIAGNOSIS — M25512 Pain in left shoulder: Secondary | ICD-10-CM

## 2018-03-30 NOTE — Therapy (Signed)
[facility] [address] [place], [place], [address] Phone: [phone]   Fax:  [phone]  Physical Therapy Treatment  Patient Details  Name: Dylan Rose MRN: [id number] Date of Birth: 06/05/1991 Referring Provider: Dr Jones BroomJustin Chandler    Encounter Date: 03/30/2018  PT End of Session - 03/30/18 1347    Visit Number  3    Number of Visits  12    Date for PT Re-Evaluation  04/29/18    Authorization Type  [facility]     PT Start Time  0122    PT Stop Time  0200    PT Time Calculation (min)  38 min       Past Medical History:  Diagnosis Date  . Medical history non-contributory     Past Surgical History:  Procedure Laterality Date  . ELBOW FRACTURE SURGERY Left 2005  . FRACTURE SURGERY    . ORIF HUMERUS FRACTURE Left 06/30/2017   Procedure: OPEN REDUCTION INTERNAL FIXATION (ORIF) PROXIMAL HUMERUS FRACTURE;  Surgeon: Yolonda Kidaogers, Jason Patrick, MD;  [facility];  Service: Orthopedics;  Laterality: Left;  180 mins  . SHOULDER HEMI-ARTHROPLASTY Left 01/28/2018   Procedure: LEFT SHOULDER HARDWARE REMOVAL AND HEMIARTHROPLASTY;  Surgeon: Jones Broomhandler, Justin, MD;  [facility];  Service: Orthopedics;  Laterality: Left;  . WISDOM TOOTH EXTRACTION      There were no vitals filed for this visit.  Subjective Assessment - 03/30/18 1325    Subjective  No pain. Doing well with the exercises.     Currently in Pain?  No/denies                       [facility] Treatment/Exercise - 03/30/18 0001      Shoulder Exercises: Supine   Horizontal ABduction  Left;15 reps    Flexion  AROM;Left;15 reps    Other Supine Exercises  rhythmic stab muti directional pressures.       Shoulder Exercises: Standing   External Rotation  AAROM;Strengthening;20 reps    Internal Rotation  20 reps;Left    Flexion  AAROM    ABduction  Left;20 reps cane    Extension  20 reps;Left;Strengthening    Extension Limitations  red    Row   20 reps    Row Limitations   red    Other Standing Exercises  Bicep and tricep red band x 20 each     Other Standing Exercises  Wall slides with sheet x 20      Manual Therapy   Passive ROM  Flexion and ER               PT Short Term Goals - 03/30/18 1400      PT SHORT TERM GOAL #1   Title  Patient will demsotrate 160 degrees of passive shoulder flexion     Baseline  160 today    Status  Achieved      PT SHORT TERM GOAL #2   Title  Patient will increase passive left shoulder ER to 65 degrees     Baseline  70 today    Status  Achieved      PT SHORT TERM GOAL #3   Title  Patient will demsotrate 5/5 gross left shoulder flexion     Status  Unable to assess        PT Long Term Goals - 03/18/18 2129      PT LONG TERM GOAL #1   Title  Patient will reach into cabinet with 2lb weight in order to perfrom ADL's     Time  6    Period  Weeks    Status  On-going      PT LONG TERM GOAL #2   Title  Patient will reach behind his back to L3 in order to perfrom hygiene tasks     Time  6    Period  Weeks    Status  New      PT LONG TERM GOAL #3   Title  Patient will demostrate 28% limitation on FOTO     Time  6    Period  Weeks    Status  New            Plan - 03/30/18 1357    Clinical Impression Statement  no pain post session though some end range pain  with ROM and tension from bands.  with exercise .  He should be close to begin overall strength exercise in next 2 weesk    PT Treatment/Interventions  ADLs/Self Care Home Management;Cryotherapy;Electrical Stimulation;Ultrasound;Iontophoresis 4mg /ml Dexamethasone;Moist Heat;Functional mobility training;Therapeutic activities;Therapeutic exercise;Patient/family education;Manual techniques;Passive range of motion;Taping    PT Next Visit Plan  Continue with ROM , band exercises active and AAROM for 1-2 weeks then begin strengthening if allowed by potocol.     PT Home Exercise Plan  wand flexion and er shoulder extension  and scpa retraction , red vs yelllow band IR/ER, red band tricep and bicep    Consulted and Agree with Plan of Care  Patient       Patient will benefit from skilled therapeutic intervention in order to improve the following deficits and impairments:  Pain, Impaired UE functional use, Decreased strength, Decreased range of motion, Decreased endurance, Decreased activity tolerance  Visit Diagnosis: Stiffness of left shoulder, not elsewhere classified  Chronic left shoulder pain  Muscle weakness (generalized)     Problem List Patient Active Problem List   Diagnosis Date Noted  . Closed fracture of left proximal humerus 06/30/2017  . Humeral head fracture, left, closed, initial encounter 06/30/2017    Caprice Red PT 03/30/2018, 2:00 PM  [facility] [address] [place], [place], [address] Phone: [phone]   Fax:  [phone]  Name: Dylan Rose MRN: [id number] Date of Birth: Jun 11, 1991

## 2018-04-08 ENCOUNTER — Encounter: Payer: Self-pay | Admitting: Physical Therapy

## 2018-04-08 ENCOUNTER — Ambulatory Visit: Payer: 59 | Admitting: Physical Therapy

## 2018-04-08 DIAGNOSIS — M25512 Pain in left shoulder: Secondary | ICD-10-CM

## 2018-04-08 DIAGNOSIS — M25612 Stiffness of left shoulder, not elsewhere classified: Secondary | ICD-10-CM

## 2018-04-08 DIAGNOSIS — M6281 Muscle weakness (generalized): Secondary | ICD-10-CM | POA: Diagnosis not present

## 2018-04-08 DIAGNOSIS — G8929 Other chronic pain: Secondary | ICD-10-CM | POA: Diagnosis not present

## 2018-04-08 NOTE — Therapy (Signed)
[facility] [address] [place], [place], [address] Phone: [phone]   Fax:  [phone]  Physical Therapy Treatment  Patient Details  Name: Dylan Rose MRN: [id number] Date of Birth: 04/10/1991 Referring Provider: Dr Jones Broom    Encounter Date: 04/08/2018  PT End of Session - 04/08/18 1532    Visit Number  4    Number of Visits  12    Date for PT Re-Evaluation  04/29/18    Authorization Type  [facility]     PT Start Time  1415    PT Stop Time  1455    PT Time Calculation (min)  40 min    Activity Tolerance  Patient tolerated treatment well    Behavior During Therapy  [facility] for tasks assessed/performed       Past Medical History:  Diagnosis Date  . Medical history non-contributory     Past Surgical History:  Procedure Laterality Date  . ELBOW FRACTURE SURGERY Left 2005  . FRACTURE SURGERY    . ORIF HUMERUS FRACTURE Left 06/30/2017   Procedure: OPEN REDUCTION INTERNAL FIXATION (ORIF) PROXIMAL HUMERUS FRACTURE;  Surgeon: Yolonda Kida, MD;  Location: [facility] OR;  Service: Orthopedics;  Laterality: Left;  180 mins  . SHOULDER HEMI-ARTHROPLASTY Left 01/28/2018   Procedure: LEFT SHOULDER HARDWARE REMOVAL AND HEMIARTHROPLASTY;  Surgeon: Jones Broom, MD;  [facility];  Service: Orthopedics;  Laterality: Left;  . WISDOM TOOTH EXTRACTION      There were no vitals filed for this visit.  Subjective Assessment - 04/08/18 1419    Subjective  Patient reports the shoulder is doing well. He is having very little pain. He is using his shoulder for functional activity. He is mostly having trouble driving.     Limitations  House hold activities    Diagnostic tests  nothing post op     Patient Stated Goals  to have full use of his left shoulder     Currently in Pain?  No/denies                       [facility] Adult PT Treatment/Exercise - 04/08/18 0001      Shoulder Exercises: Standing   External Rotation  20 reps;Limitations    External Rotation Limitations  red    Internal Rotation  20 reps;Left;Limitations    Internal Rotation Limitations  red    Extension  20 reps;Strengthening;Both    Extension Limitations  green     Other Standing Exercises  standing shoulder flexion 3lb scpation 2lb 2x10       Shoulder Exercises: Power [employer] Limitations  15 lbs 2x15 lower handles     Other [employer] Exercises  lat pulld down 15 lb 2x15       Manual Therapy   Passive ROM  Flexion and ER             PT Education - 04/08/18 1531    Education Details  reviewed technique wih light gym equipment     Person(s) Educated  Patient    Methods  Explanation;Demonstration;Tactile cues;Verbal cues    Comprehension  Verbalized understanding;Returned demonstration;Verbal cues required;Tactile cues required       PT Short Term Goals - 04/08/18 2017      PT SHORT TERM GOAL #1   Title  Patient will demsotrate 160 degrees of passive shoulder flexion     Baseline  160 today    Time  3  Period  Weeks    Status  Achieved      PT SHORT TERM GOAL #2   Title  Patient will increase passive left shoulder ER to 65 degrees     Baseline  70 today    Time  3    Period  Weeks    Status  On-going      PT SHORT TERM GOAL #3   Title  Patient will demsotrate 5/5 gross left shoulder flexion     Baseline  not measured     Time  3    Period  Weeks    Status  On-going        PT Long Term Goals - 03/18/18 2129      PT LONG TERM GOAL #1   Title  Patient will reach into cabinet with 2lb weight in order to perfrom ADL's     Time  6    Period  Weeks    Status  On-going      PT LONG TERM GOAL #2   Title  Patient will reach behind his back to L3 in order to perfrom hygiene tasks     Time  6    Period  Weeks    Status  New      PT LONG TERM GOAL #3   Title  Patient will demostrate 28% limitation on FOTO     Time  6    Period  Weeks    Status  New            Plan -  04/08/18 1546    Clinical Impression Statement  Patient is making excellent progress. He was able to perfrom light forward flexion exercises. He was also able to perform light scpaular exercises on the gym equipment. He is still lacking some end range ER. Therapy will continue to progress as tolerated.     Clinical Presentation  Stable    Clinical Decision Making  Low    Rehab Potential  Good    PT Frequency  2x / week    PT Duration  6 weeks    PT Treatment/Interventions  ADLs/Self Care Home Management;Cryotherapy;Electrical Stimulation;Ultrasound;Iontophoresis 4mg /ml Dexamethasone;Moist Heat;Functional mobility training;Therapeutic activities;Therapeutic exercise;Patient/family education;Manual techniques;Passive range of motion;Taping    PT Next Visit Plan  Continue with ROM , band exercises active and AAROM for 1-2 weeks then begin strengthening if allowed by potocol.     PT Home Exercise Plan  wand flexion and er shoulder extension and scpa retraction , red vs yelllow band IR/ER, red band tricep and bicep    Consulted and Agree with Plan of Care  Patient       Patient will benefit from skilled therapeutic intervention in order to improve the following deficits and impairments:  Pain, Impaired UE functional use, Decreased strength, Decreased range of motion, Decreased endurance, Decreased activity tolerance  Visit Diagnosis: Stiffness of left shoulder, not elsewhere classified  Muscle weakness (generalized)  Chronic left shoulder pain     Problem List Patient Active Problem List   Diagnosis Date Noted  . Closed fracture of left proximal humerus 06/30/2017  . Humeral head fracture, left, closed, initial encounter 06/30/2017    Dessie Comaavid J Lucca Greggs PT DPT  04/08/2018, 8:22 PM  [facility] [address] [place], [place], [address] Phone: [phone]   Fax:  [phone]  Name: Kreg ShropshireCharles Elbert Lias IV MRN: [id number] Date of Birth:  06/03/1991

## 2018-04-12 ENCOUNTER — Encounter: Payer: Self-pay | Admitting: Physical Therapy

## 2018-04-12 ENCOUNTER — Ambulatory Visit: Payer: 59 | Admitting: Physical Therapy

## 2018-04-12 DIAGNOSIS — M25612 Stiffness of left shoulder, not elsewhere classified: Secondary | ICD-10-CM | POA: Diagnosis not present

## 2018-04-12 DIAGNOSIS — G8929 Other chronic pain: Secondary | ICD-10-CM | POA: Diagnosis not present

## 2018-04-12 DIAGNOSIS — M6281 Muscle weakness (generalized): Secondary | ICD-10-CM | POA: Diagnosis not present

## 2018-04-12 DIAGNOSIS — M25512 Pain in left shoulder: Secondary | ICD-10-CM | POA: Diagnosis not present

## 2018-04-12 NOTE — Therapy (Signed)
[place], [place], [address] Phone: [phone]   Fax:  [phone]  Physical Therapy Treatment  Patient Details  Name: Dylan Rose MRN: [id number] Date of Birth: 07-28-1991 Referring Provider: Dr Tania Ade    Encounter Date: 04/12/2018  PT End of Session - 04/12/18 1814    Visit Number  5    Number of Visits  12    Date for PT Re-Evaluation  04/29/18    PT Start Time  1505    PT Stop Time  1546    PT Time Calculation (min)  41 min    Behavior During Therapy  [facility] for tasks assessed/performed       Past Medical History:  Diagnosis Date  . Medical history non-contributory     Past Surgical History:  Procedure Laterality Date  . ELBOW FRACTURE SURGERY Left 2005  . FRACTURE SURGERY    . ORIF HUMERUS FRACTURE Left 06/30/2017   Procedure: OPEN REDUCTION INTERNAL FIXATION (ORIF) PROXIMAL HUMERUS FRACTURE;  Surgeon: Nicholes Stairs, MD;  Location: [place];  Service: Orthopedics;  Laterality: Left;  180 mins  . SHOULDER HEMI-ARTHROPLASTY Left 01/28/2018   Procedure: LEFT SHOULDER HARDWARE REMOVAL AND HEMIARTHROPLASTY;  Surgeon: Tania Ade, MD;  Location: [place];  Service: Orthopedics;  Laterality: Left;  . WISDOM TOOTH EXTRACTION      There were no vitals filed for this visit.  Subjective Assessment - 04/12/18 1506    Subjective  No pain  .  Does the exercises.  Sleeping well.      Currently in Pain?  No/denies         [facility] PT Assessment - 04/12/18 0001      PROM   Left Shoulder Flexion  155 Degrees    Left Shoulder Internal Rotation  52 Degrees    Left Shoulder External Rotation  70 Degrees                   [facility] Adult PT Treatment/Exercise - 04/12/18 0001      Shoulder Exercises: Supine   Other Supine Exercises  rhythmic stab muti directional pressures.       Shoulder Exercises: Standing   External Rotation  20 reps;Limitations roll used    External  Rotation Limitations  red    Internal Rotation  20 reps;Left;Limitations cued for technique,  roll used    Internal Rotation Limitations  red    Extension  20 reps;Strengthening;Both    Extension Limitations  green     Other Standing Exercises  standing shoulder flexion 3lb scpation 2lb 2x10       Shoulder Exercises: Power [employer] Limitations  25  LBS  15 x 2 sets feels equal  both sides    Other [employer] Exercises  lat pull down 25 lb 2x15       Manual Therapy   Passive ROM  Flexion and ER             PT Education - 04/12/18 1814    Education Details  no new       PT Short Term Goals - 04/12/18 1816      PT SHORT TERM GOAL #1   Title  Patient will demsotrate 160 degrees of passive shoulder flexion     Time  3    Period  Weeks    Status  Achieved      PT SHORT TERM GOAL #2   Title  Patient  will increase passive left shoulder ER to 65 degrees     Baseline  70 today    Time  3    Period  Weeks    Status  Achieved      PT SHORT TERM GOAL #3   Title  Patient will demsotrate 5/5 gross left shoulder flexion     Baseline  not measured     Time  3    Period  Weeks    Status  Unable to assess        PT Long Term Goals - 03/18/18 2129      PT LONG TERM GOAL #1   Title  Patient will reach into cabinet with 2lb weight in order to perfrom ADL's     Time  6    Period  Weeks    Status  On-going      PT LONG TERM GOAL #2   Title  Patient will reach behind his back to L3 in order to perfrom hygiene tasks     Time  6    Period  Weeks    Status  New      PT LONG TERM GOAL #3   Title  Patient will demostrate 28% limitation on FOTO     Time  6    Period  Weeks    Status  New            Plan - 04/12/18 1814    Clinical Impression Statement  slightly sore at end of session 2/10 .  He declined the need for Modalities for pain. STG#2 met .  Scar soft however muscles surrounding congested and stiff. ER  improving see flow sheet.     PT Next Visit Plan   Continue with ROM , band exercises active and AAROM for 1-2 weeks then begin strengthening if allowed by potocol.     PT Home Exercise Plan  wand flexion and er shoulder extension and scpa retraction , red vs yelllow band IR/ER, red band tricep and bicep    Consulted and Agree with Plan of Care  Patient       Patient will benefit from skilled therapeutic intervention in order to improve the following deficits and impairments:     Visit Diagnosis: Stiffness of left shoulder, not elsewhere classified  Muscle weakness (generalized)  Chronic left shoulder pain     Problem List Patient Active Problem List   Diagnosis Date Noted  . Closed fracture of left proximal humerus 06/30/2017  . Humeral head fracture, left, closed, initial encounter 06/30/2017    Dylan Rose PTA 04/12/2018, 6:18 PM  [facility] [address] [place], [place], [address] Phone: [phone]   Fax:  [phone]  Name: Dylan Rose MRN: [id number] Date of Birth: June 20, 1991

## 2018-04-19 ENCOUNTER — Encounter: Payer: Self-pay | Admitting: Physical Therapy

## 2018-04-19 ENCOUNTER — Ambulatory Visit: Payer: 59 | Admitting: Physical Therapy

## 2018-04-19 DIAGNOSIS — M25612 Stiffness of left shoulder, not elsewhere classified: Secondary | ICD-10-CM

## 2018-04-19 DIAGNOSIS — G8929 Other chronic pain: Secondary | ICD-10-CM

## 2018-04-19 DIAGNOSIS — M25512 Pain in left shoulder: Secondary | ICD-10-CM | POA: Diagnosis not present

## 2018-04-19 DIAGNOSIS — M6281 Muscle weakness (generalized): Secondary | ICD-10-CM | POA: Diagnosis not present

## 2018-04-19 NOTE — Therapy (Addendum)
[place], [place], [address] Phone: [phone]   Fax:  [phone]  Physical Therapy Treatment/Progress Note/ Discharge  Patient Details  Name: Dylan Rose MRN: [id number] Date of Birth: [birth year] Referring Provider: Dr Tania Ade    Encounter Date: 04/19/2018  PT End of Session - 04/19/18 1414    Visit Number  6    Number of Visits  12    Date for PT Re-Evaluation  04/29/18    Authorization Type  [facility]     PT Start Time  1414    PT Stop Time  1453    PT Time Calculation (min)  39 min    Activity Tolerance  Patient tolerated treatment well    Behavior During Therapy  [facility] for tasks assessed/performed       Past Medical History:  Diagnosis Date  . Medical history non-contributory     Past Surgical History:  Procedure Laterality Date  . ELBOW FRACTURE SURGERY Left 2005  . FRACTURE SURGERY    . ORIF HUMERUS FRACTURE Left 06/30/2017   Procedure: OPEN REDUCTION INTERNAL FIXATION (ORIF) PROXIMAL HUMERUS FRACTURE;  Surgeon: Nicholes Stairs, MD;  Location: [place];  Service: Orthopedics;  Laterality: Left;  180 mins  . SHOULDER HEMI-ARTHROPLASTY Left 01/28/2018   Procedure: LEFT SHOULDER HARDWARE REMOVAL AND HEMIARTHROPLASTY;  Surgeon: Tania Ade, MD;  Location: [place];  Service: Orthopedics;  Laterality: Left;  . WISDOM TOOTH EXTRACTION      There were no vitals filed for this visit.      [facility] PT Assessment - 04/19/18 0001      AROM   Left Shoulder Internal Rotation  -- can reach to L5     Left Shoulder External Rotation  -- can reach behind his head      PROM   Left Shoulder Flexion  164 Degrees      Strength   Left Shoulder Flexion  5/5    Left Shoulder Internal Rotation  5/5    Left Shoulder External Rotation  4+/5                   [facility] Adult PT Treatment/Exercise - 04/19/18 0001      Shoulder Exercises: Standing   External Rotation  20  reps;Limitations roll used    External Rotation Limitations  red    Internal Rotation  20 reps;Left;Limitations cued for technique,  roll used    Extension  20 reps;Strengthening;Both    Extension Limitations  green     Other Standing Exercises  standing shoulder flexion 3lb scpation 2lb 2x10       Shoulder Exercises: [employer] Limitations  25  LBS  15 x 2 sets feels equal  both sides    Other Power Tower Exercises  lat pull down 25 lb 2x15       Manual Therapy   Passive ROM  Flexion and ER             PT Education - 04/19/18 1415    Education Details  reviewed home HEP     Person(s) Educated  Patient    Methods  Explanation;Demonstration;Tactile cues;Verbal cues    Comprehension  Verbalized understanding;Returned demonstration;Verbal cues required;Tactile cues required;Need further instruction       PT Short Term Goals - 04/12/18 1816      PT SHORT TERM GOAL #1   Title  Patient will demsotrate 160 degrees of passive shoulder flexion  Time  3    Period  Weeks    Status  Achieved      PT SHORT TERM GOAL #2   Title  Patient will increase passive left shoulder ER to 65 degrees     Baseline  70 today    Time  3    Period  Weeks    Status  Achieved      PT SHORT TERM GOAL #3   Title  Patient will demsotrate 5/5 gross left shoulder flexion     Baseline  not measured     Time  3    Period  Weeks    Status  Unable to assess        PT Long Term Goals - 03/18/18 2129      PT LONG TERM GOAL #1   Title  Patient will reach into cabinet with 2lb weight in order to perfrom ADL's     Time  6    Period  Weeks    Status  On-going      PT LONG TERM GOAL #2   Title  Patient will reach behind his back to L3 in order to perfrom hygiene tasks     Time  6    Period  Weeks    Status  New      PT LONG TERM GOAL #3   Title  Patient will demostrate 28% limitation on FOTO     Time  6    Period  Weeks    Status  New            Plan - 04/19/18 1417     Clinical Impression Statement  Patient has made great progress. His range is progressingand he is having pain free function. At this point he is doing well enough to continue with his exercises on his own. He will go to the MD on Wednesday. Therapy will proceed per MD reccomendation. He has improved motion and strength. His only limiations at this time are passive ER and active IR. Both are improving.     Clinical Presentation  Stable    Clinical Decision Making  Low    Rehab Potential  Good    PT Frequency  2x / week    PT Duration  6 weeks    PT Treatment/Interventions  ADLs/Self Care Home Management;Cryotherapy;Electrical Stimulation;Ultrasound;Iontophoresis 58m/ml Dexamethasone;Moist Heat;Functional mobility training;Therapeutic activities;Therapeutic exercise;Patient/family education;Manual techniques;Passive range of motion;Taping    PT Next Visit Plan  Continue with ROM , band exercises active and AAROM for 1-2 weeks then begin strengthening if allowed by potocol.     PT Home Exercise Plan  wand flexion and er shoulder extension and scpa retraction , red vs yelllow band IR/ER, red band tricep and bicep    Consulted and Agree with Plan of Care  Patient       Patient will benefit from skilled therapeutic intervention in order to improve the following deficits and impairments:  Pain, Impaired UE functional use, Decreased strength, Decreased range of motion, Decreased endurance, Decreased activity tolerance  Visit Diagnosis: Stiffness of left shoulder, not elsewhere classified  Muscle weakness (generalized)  Chronic left shoulder pain  PHYSICAL THERAPY DISCHARGE SUMMARY  Visits from Start of Care: 6  Current functional level related to goals / functional outcomes: Significant improvement in movement    Remaining deficits: Some stiffness at end range    Education / Equipment: HEP Plan: Patient agrees to discharge.  Patient goals were not met. Patient is being discharged due  to  meeting the stated rehab goals.  ?????       Problem List Patient Active Problem List   Diagnosis Date Noted  . Closed fracture of left proximal humerus 06/30/2017  . Humeral head fracture, left, closed, initial encounter 06/30/2017    Carney Living  PT DPT  04/19/2018, 3:47 PM     [place], [place], [address] Phone: [phone]   Fax:  [phone]  Name: Dylan Rose MRN: [id number] Date of Birth: November 28, 1990

## 2018-04-21 DIAGNOSIS — Z96612 Presence of left artificial shoulder joint: Secondary | ICD-10-CM | POA: Diagnosis not present

## 2018-05-20 DIAGNOSIS — Z3009 Encounter for other general counseling and advice on contraception: Secondary | ICD-10-CM | POA: Diagnosis not present

## 2018-05-26 IMAGING — DX DG SHOULDER 1V*L*
1 series · 1 of 1 positions shown · non-contrast
Comparison: CT of the shoulder 01/27/2018

CLINICAL DATA: Postoperative radiograph.

EXAM:
LEFT SHOULDER - 1 VIEW

[shoulder]
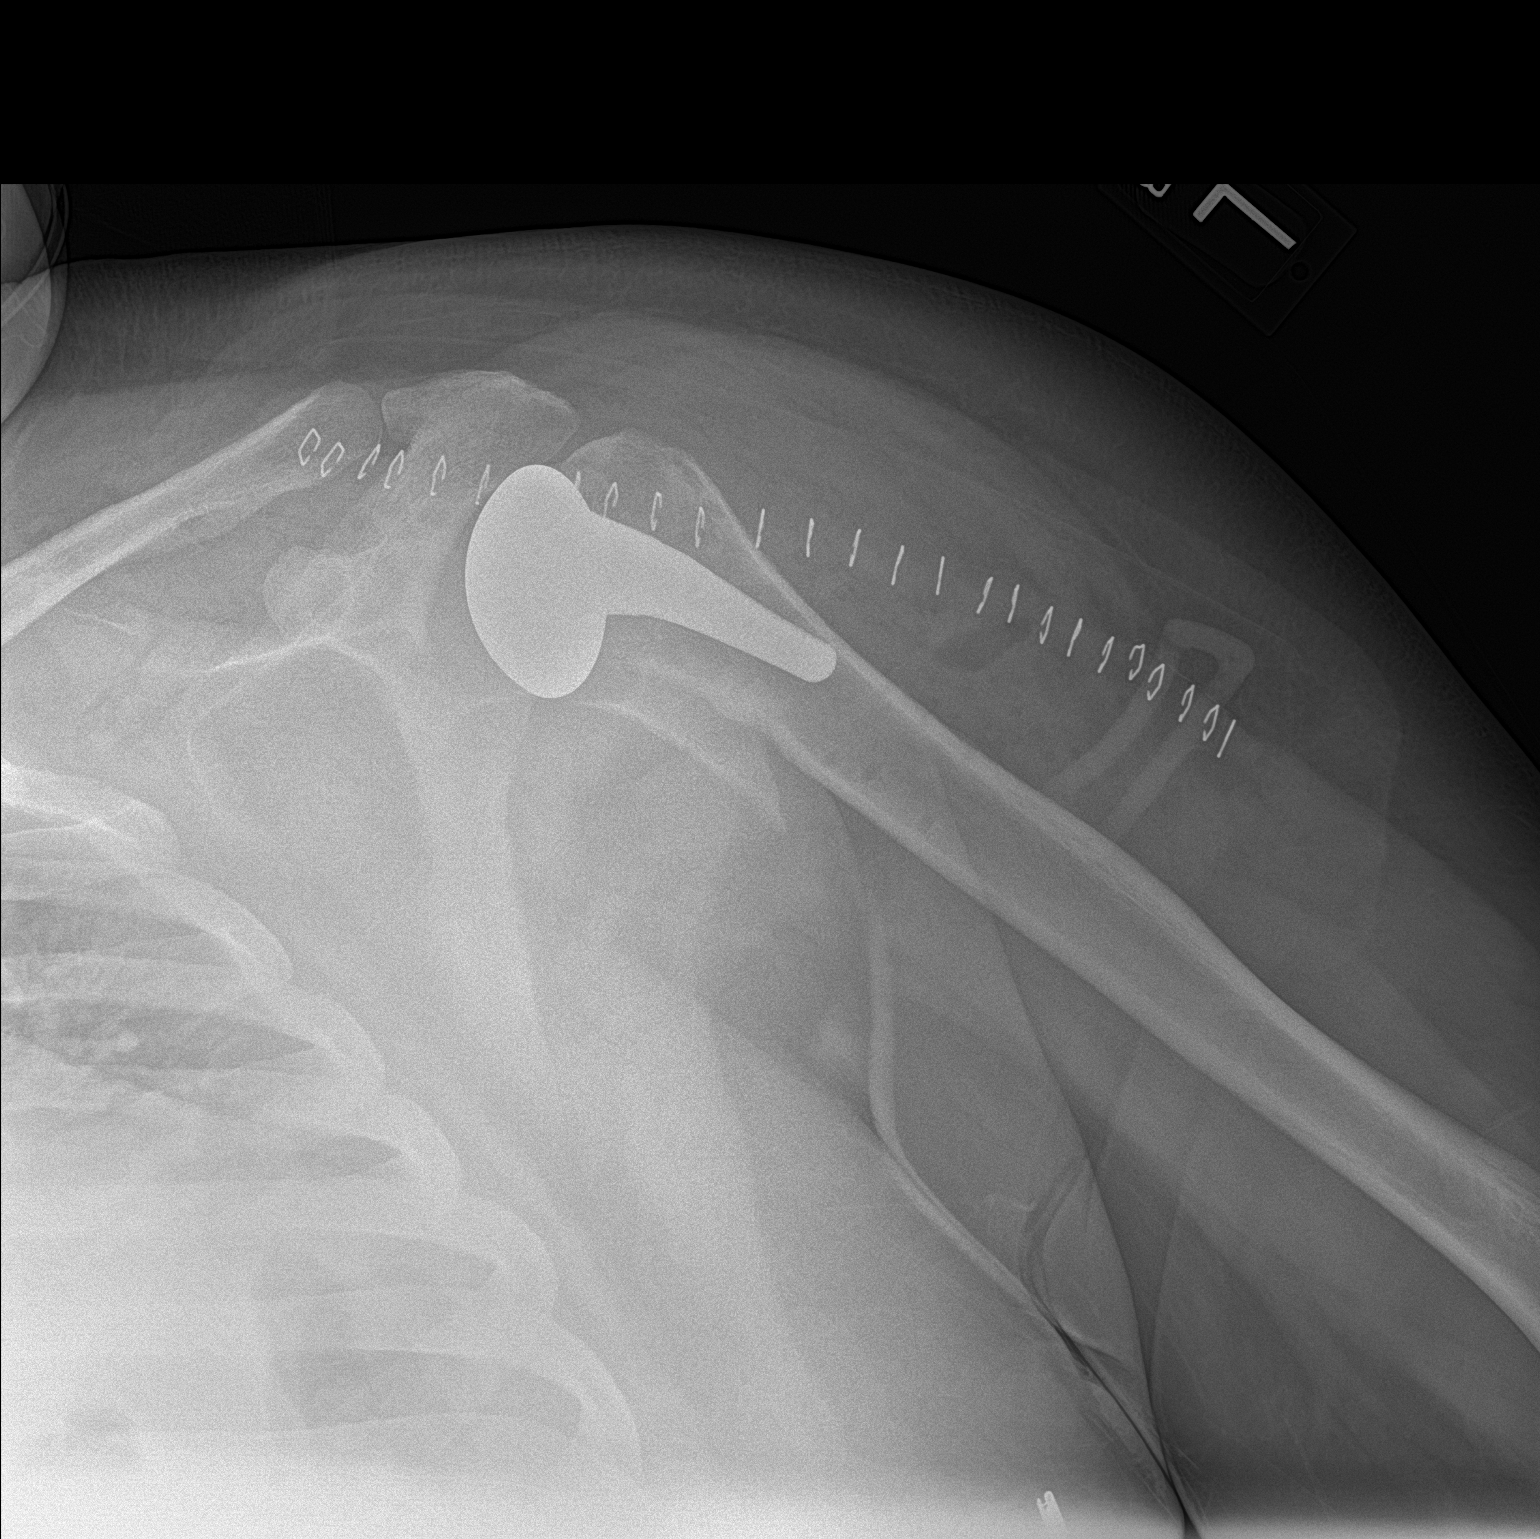

[1 of 1 positions shown; findings below may reference images not displayed]

FINDINGS: Post left humeral head replacement. The prosthetic humeral head is
aligned with the acetabulum. Stable posttraumatic deformity of the
left proximal humerus. Post removal of pre-existing sideplate and
screw fixation of the proximal humerus. Expected postsurgical soft
tissue emphysema. Skin staples noted.
IMPRESSION: Post left humeral hemiarthroplasty without evidence of immediate
complications.

## 2018-07-01 DIAGNOSIS — Z302 Encounter for sterilization: Secondary | ICD-10-CM | POA: Diagnosis not present

## 2018-07-28 DIAGNOSIS — M25512 Pain in left shoulder: Secondary | ICD-10-CM | POA: Diagnosis not present

## 2018-09-02 DIAGNOSIS — H5203 Hypermetropia, bilateral: Secondary | ICD-10-CM | POA: Diagnosis not present

## 2018-10-31 ENCOUNTER — Telehealth: Payer: 59 | Admitting: Physician Assistant

## 2018-10-31 DIAGNOSIS — J029 Acute pharyngitis, unspecified: Secondary | ICD-10-CM

## 2018-10-31 DIAGNOSIS — R6889 Other general symptoms and signs: Secondary | ICD-10-CM | POA: Diagnosis not present

## 2018-10-31 DIAGNOSIS — R05 Cough: Secondary | ICD-10-CM

## 2018-10-31 DIAGNOSIS — R51 Headache: Secondary | ICD-10-CM | POA: Diagnosis not present

## 2018-10-31 MED ORDER — OSELTAMIVIR PHOSPHATE 75 MG PO CAPS: 75.0000 mg | ORAL_CAPSULE | Freq: Two times a day (BID) | ORAL | 0 refills | Status: DC

## 2018-10-31 NOTE — Progress Notes (Signed)

## 2018-10-31 NOTE — Progress Notes (Signed)
I have spent 5 minutes reviewing questionnaire and chart, as well as time for medical decision making.   Piedad Climes, PA-C

## 2018-11-01 ENCOUNTER — Encounter: Payer: Self-pay | Admitting: Physician Assistant

## 2018-11-01 ENCOUNTER — Ambulatory Visit: Payer: Self-pay | Admitting: Physician Assistant

## 2018-11-01 VITALS — BP 122/78 | HR 85 | Temp 98.5°F | Resp 14 | Wt >= 6400 oz

## 2018-11-01 DIAGNOSIS — J020 Streptococcal pharyngitis: Secondary | ICD-10-CM

## 2018-11-01 DIAGNOSIS — R6889 Other general symptoms and signs: Secondary | ICD-10-CM

## 2018-11-01 LAB — POCT INFLUENZA A/B
INFLUENZA A, POC: NEGATIVE
INFLUENZA B, POC: NEGATIVE

## 2018-11-01 LAB — POCT RAPID STREP A (OFFICE): Rapid Strep A Screen: POSITIVE — AB

## 2018-11-01 MED ORDER — AMOXICILLIN 500 MG PO CAPS: 500.0000 mg | ORAL_CAPSULE | Freq: Two times a day (BID) | ORAL | 0 refills | Status: DC

## 2018-11-01 NOTE — Patient Instructions (Addendum)
Sore Throat  -You have strep throat. Please take antibiotic as prescribed. May use over the counter ibuprofen or tylenol as prescribed for pain. Do not take tamiflu at this time. Continue drinking fluids and eating light meals. Seek care immediately at ED if you develop any inability to swallow, drooling, voice change, or other concerning symptoms.    When you have a sore throat, your throat may feel:  Tender.  Burning.  Irritated.  Scratchy.  Painful when you swallow.  Painful when you talk. Many things can cause a sore throat, such as:  An infection.  Allergies.  Dry air.  Smoke or pollution.  Radiation treatment.  Gastroesophageal reflux disease (GERD).  A tumor. A sore throat can be the first sign of another sickness. It can happen with other problems, like:  Coughing.  Sneezing.  Fever.  Swelling in the neck. Most sore throats go away without treatment. Follow these instructions at home:      Take over-the-counter medicines only as told by your doctor. ? If your child has a sore throat, do not give your child aspirin.  Drink enough fluids to keep your pee (urine) pale yellow.  Rest when you feel you need to.  To help with pain: ? Sip warm liquids, such as broth, herbal tea, or warm water. ? Eat or drink cold or frozen liquids, such as frozen ice pops. ? Gargle with a salt-water mixture 3-4 times a day or as needed. To make a salt-water mixture, add -1 tsp (3-6 g) of salt to 1 cup (237 mL) of warm water. Mix it until you cannot see the salt anymore. ? Suck on hard candy or throat lozenges. ? Put a cool-mist humidifier in your bedroom at night. ? Sit in the bathroom with the door closed for 5-10 minutes while you run hot water in the shower.  Do not use any products that contain nicotine or tobacco, such as cigarettes, e-cigarettes, and chewing tobacco. If you need help quitting, ask your doctor.  Wash your hands well and often with soap and water.  If soap and water are not available, use hand sanitizer. Contact a doctor if:  You have a fever for more than 2-3 days.  You keep having symptoms for more than 2-3 days.  Your throat does not get better in 7 days.  You have a fever and your symptoms suddenly get worse.  Your child who is 3 months to 51 years old has a temperature of 102.48F (39C) or higher. Get help right away if:  You have trouble breathing.  You cannot swallow fluids, soft foods, or your saliva.  You have swelling in your throat or neck that gets worse.  You keep feeling sick to your stomach (nauseous).  You keep throwing up (vomiting). Summary  A sore throat is pain, burning, irritation, or scratchiness in the throat. Many things can cause a sore throat.  Take over-the-counter medicines only as told by your doctor. Do not give your child aspirin.  Drink plenty of fluids, and rest as needed.  Contact a doctor if your symptoms get worse or your sore throat does not get better within 7 days. This information is not intended to replace advice given to you by your health care provider. Make sure you discuss any questions you have with your health care provider. Document Released: 06/17/2008 Document Revised: 02/08/2018 Document Reviewed: 02/08/2018 Elsevier Interactive Patient Education  2019 [employer].

## 2018-11-01 NOTE — Progress Notes (Signed)
MRN: [id number] DOB: 11/16/1990  Subjective:   Dylan Rose is a 28 y.o. male presenting for chief complaint of fever (x2days (ibuprofen, Nyquil/Dayquil) .  Reports 2 day history of sore throat and fever (Tmax  102). Denies sinus congestion , sinus pain, rhinorrhea, inability to swallow, voice change, dry cough, productive cough, wheezing, shortness of breath, chest pain and myalgia, nausea, vomiting, abdominal pain and diarrhea. Has tried nyquil and dayquil with relief of fever, but not of sore throat.  Did evisit yesterday and dx with flu, given Rx of tamiflu-which he did not start. His wife wanted him to come in and make sure he got swabbed for the flu because they have kids at home. Denies recent sick contact exposure. Denies smoking. Did not get flu shot this year.  No PMH of asthma, COPD, DM, or HTN.  Denies any other aggravating or relieving factors, no other questions or concerns.  Review of Systems  HENT: Negative for congestion and ear pain.   Musculoskeletal: Negative for neck pain.  Skin: Negative for rash.  Neurological: Negative for dizziness and headaches.    Dylan Rose has a current medication list which includes the following prescription(s): amoxicillin and oseltamivir. Also is allergic to shellfish allergy.  Dylan Rose  has a past medical history of Medical history non-contributory. Also  has a past surgical history that includes Fracture surgery; ORIF humerus fracture (Left, 06/30/2017); Elbow fracture surgery (Left, 2005); Wisdom tooth extraction; and Shoulder hemi-arthroplasty (Left, 01/28/2018).   Objective:   Vitals: BP 122/78   Pulse 85   Temp 98.5 F (36.9 C)   Resp 14   Wt (!) 400 lb 12.8 oz (181.8 kg)   SpO2 98%   BMI 50.10 kg/m   Physical Exam Vitals signs reviewed.  Constitutional:      General: He is not in acute distress.    Appearance: He is well-developed. He is obese. He is not toxic-appearing.  HENT:     Head: Normocephalic and atraumatic.    Right Ear: Ear canal and external ear normal.     Left Ear: Ear canal and external ear normal.     Ears:     Comments: Bilateral ear canals impacted with copious amount of dark brown dry cerumen, unable to visualize TMs.      Nose: Nose normal.     Right Sinus: No maxillary sinus tenderness or frontal sinus tenderness.     Left Sinus: No maxillary sinus tenderness or frontal sinus tenderness.     Mouth/Throat:     Lips: Pink.     Mouth: Mucous membranes are moist.     Pharynx: Uvula midline. Posterior oropharyngeal erythema present.     Tonsils: Tonsillar exudate present. No tonsillar abscesses. Swelling: 2+ on the right. 2+ on the left.     Comments: Tonsils are erythematous b/l  Eyes:     Conjunctiva/sclera: Conjunctivae normal.  Neck:     Musculoskeletal: Full passive range of motion without pain and normal range of motion.     Trachea: Phonation normal.  Pulmonary:     Effort: Pulmonary effort is normal.  Lymphadenopathy:     Head:     Right side of head: No submental, submandibular, tonsillar, preauricular, posterior auricular or occipital adenopathy.     Left side of head: No submental, submandibular, tonsillar, preauricular, posterior auricular or occipital adenopathy.     Cervical: No cervical adenopathy.     Upper Body:     Right upper body: No supraclavicular adenopathy.  Left upper body: No supraclavicular adenopathy.  Skin:    General: Skin is warm and dry.  Neurological:     Mental Status: He is alert.     Results for orders placed or performed in visit on 11/01/18 (from the past 24 hour(s))  POCT Influenza A/B     Status: None   Collection Time: 11/01/18  8:49 AM  Result Value Ref Range   Influenza A, POC Negative Negative   Influenza B, POC Negative Negative  POCT rapid strep A     Status: Abnormal   Collection Time: 11/01/18  8:49 AM  Result Value Ref Range   Rapid Strep A Screen Positive (A) Negative    Assessment and Plan :  1. Strep throat He  is overall well appearing, NAD. VSS. Hx and PE findings consistent with strep throat. POC testing neg for flu and positive for strep. No nuchal rigidity, peritonsillar erythema or edema, drooling, stridor, hot potato voice, or signs of respiratory distress noted on exam. Provided pt with Rx of amoxicillin. Advised him to not use tamiflu Rx received through Evisit yesterday. May use OTC ibuprofen/tylenol as prescribed as need for pain.  Exam findings, diagnosis etiology, medication use, indications, side effects, risks, benefits, and alternatives of the medications and treatment plan prescribed today and reviewed with patient. Follow- Up and discharge instructions provided. No emergent/urgent issues found on exam. Patient education was provided. Patient verbalized understanding of information provided and agrees with plan of care (POC), all questions answered. No barriers to understanding were identified. Red flags discussed in detail. The patient is advised to f/u with family doctor if no improvement with tx plan, or to seek the care of the closest emergency department if condition worsens/develops new concerning sx with the above plan.  - amoxicillin (AMOXIL) 500 MG capsule; Take 1 capsule (500 mg total) by mouth 2 (two) times daily.  Dispense: 20 capsule; Refill: 0  2. Flu-like symptoms - POCT Influenza A/B - POCT rapid strep A   Benjiman Core, PA-C  [facility] 11/01/2018 8:51 AM

## 2018-11-03 ENCOUNTER — Telehealth: Payer: Self-pay

## 2018-11-03 NOTE — Telephone Encounter (Signed)
Patient was not available.

## 2018-11-23 ENCOUNTER — Ambulatory Visit: Payer: Self-pay | Admitting: Nurse Practitioner

## 2018-11-23 VITALS — BP 120/80 | HR 108 | Temp 99.6°F | Resp 20 | Wt >= 6400 oz

## 2018-11-23 DIAGNOSIS — R6889 Other general symptoms and signs: Secondary | ICD-10-CM

## 2018-11-23 DIAGNOSIS — R509 Fever, unspecified: Secondary | ICD-10-CM

## 2018-11-23 LAB — POCT INFLUENZA A/B
INFLUENZA A, POC: NEGATIVE
INFLUENZA B, POC: NEGATIVE

## 2018-11-23 MED ORDER — FLUTICASONE PROPIONATE 50 MCG/ACT NA SUSP: 2.0000 | Freq: Every day | NASAL | 0 refills | Status: DC

## 2018-11-23 MED ORDER — BALOXAVIR MARBOXIL(80 MG DOSE) 2 X 40 MG PO TBPK: 80.0000 mg | ORAL_TABLET | Freq: Once | ORAL | 0 refills | Status: AC

## 2018-11-23 MED ORDER — IBUPROFEN 200 MG PO TABS: 800.0000 mg | ORAL_TABLET | Freq: Once | ORAL | Status: DC

## 2018-11-23 MED ORDER — PROMETHAZINE-DM 6.25-15 MG/5ML PO SYRP: 5.0000 mL | ORAL_SOLUTION | Freq: Four times a day (QID) | ORAL | 0 refills | Status: AC | PRN

## 2018-11-23 MED FILL — FLUTICASONE PROP 50 MCG SPR: 50 | 30 days supply | Qty: 16 | Fill #0

## 2018-11-23 MED FILL — XOFLUZA 40 (2) MG TBPK: 2 X 40 | 1 days supply | Qty: 2 | Fill #0

## 2018-11-23 MED FILL — PROMETHAZINE W/DM SYRUP: 6.25-15 | 7 days supply | Qty: 140 | Fill #0

## 2018-11-23 NOTE — Patient Instructions (Addendum)
Influenza-Like Symptoms, Adult -Take medication as prescribed. -Ibuprofen or Tylenol for pain, fever, or general discomfort. -Increase fluids. -Get plenty of rest. -Sleep elevated on at least 2 pillows at bedtime to help with cough. -Use a humidifier or vaporizer when at home and during sleep. -May use a teaspoon of honey or over-the-counter cough drops to help with cough. -May use normal saline nasal spray to help with nasal congestion throughout the day. -Remain home until you have been fever-free for at least 48 hours. -Follow-up with your PCP if your symptoms do not improve within 48 hours.  Influenza, more commonly known as "the flu," is a viral infection that mainly affects the respiratory tract. The respiratory tract includes organs that help you breathe, such as the lungs, nose, and throat. The flu causes many symptoms similar to the common cold along with high fever and body aches. The flu spreads easily from person to person (is contagious). Getting a flu shot (influenza vaccination) every year is the best way to prevent the flu. What are the causes? This condition is caused by the influenza virus. You can get the virus by:  Breathing in droplets that are in the air from an infected person's cough or sneeze.  Touching something that has been exposed to the virus (has been contaminated) and then touching your mouth, nose, or eyes. What increases the risk? The following factors may make you more likely to get the flu:  Not washing or sanitizing your hands often.  Having close contact with many people during cold and flu season.  Touching your mouth, eyes, or nose without first washing or sanitizing your hands.  Not getting a yearly (annual) flu shot. You may have a higher risk for the flu, including serious problems such as a lung infection (pneumonia), if you:  Are older than 65.  Are pregnant.  Have a weakened disease-fighting system (immune system). You may have a  weakened immune system if you: ? Have HIV or AIDS. ? Are undergoing chemotherapy. ? Are taking medicines that reduce (suppress) the activity of your immune system.  Have a long-term (chronic) illness, such as heart disease, kidney disease, diabetes, or lung disease.  Have a liver disorder.  Are severely overweight (morbidly obese).  Have anemia. This is a condition that affects your red blood cells.  Have asthma. What are the signs or symptoms? Symptoms of this condition usually begin suddenly and last 4-14 days. They may include:  Fever and chills.  Headaches, body aches, or muscle aches.  Sore throat.  Cough.  Runny or stuffy (congested) nose.  Chest discomfort.  Poor appetite.  Weakness or fatigue.  Dizziness.  Nausea or vomiting. How is this diagnosed? This condition may be diagnosed based on:  Your symptoms and medical history.  A physical exam.  Swabbing your nose or throat and testing the fluid for the influenza virus. How is this treated? If the flu is diagnosed early, you can be treated with medicine that can help reduce how severe the illness is and how long it lasts (antiviral medicine). This may be given by mouth (orally) or through an IV. Taking care of yourself at home can help relieve symptoms. Your health care provider may recommend:  Taking over-the-counter medicines.  Drinking plenty of fluids. In many cases, the flu goes away on its own. If you have severe symptoms or complications, you may be treated in a hospital. Follow these instructions at home: Activity  Rest as needed and get plenty of sleep.  Stay home from work or school as told by your health care provider. Unless you are visiting your health care provider, avoid leaving home until your fever has been gone for 24 hours without taking medicine. Eating and drinking  Take an oral rehydration solution (ORS). This is a drink that is sold at pharmacies and retail stores.  Drink  enough fluid to keep your urine pale yellow.  Drink clear fluids in small amounts as you are able. Clear fluids include water, ice chips, diluted fruit juice, and low-calorie sports drinks.  Eat bland, easy-to-digest foods in small amounts as you are able. These foods include bananas, applesauce, rice, lean meats, toast, and crackers.  Avoid drinking fluids that contain a lot of sugar or caffeine, such as energy drinks, regular sports drinks, and soda.  Avoid alcohol.  Avoid spicy or fatty foods. General instructions      Take over-the-counter and prescription medicines only as told by your health care provider.  Use a cool mist humidifier to add humidity to the air in your home. This can make it easier to breathe.  Cover your mouth and nose when you cough or sneeze.  Wash your hands with soap and water often, especially after you cough or sneeze. If soap and water are not available, use alcohol-based hand sanitizer.  Keep all follow-up visits as told by your health care provider. This is important. How is this prevented?   Get an annual flu shot. You may get the flu shot in late summer, fall, or winter. Ask your health care provider when you should get your flu shot.  Avoid contact with people who are sick during cold and flu season. This is generally fall and winter. Contact a health care provider if:  You develop new symptoms.  You have: ? Chest pain. ? Diarrhea. ? A fever.  Your cough gets worse.  You produce more mucus.  You feel nauseous or you vomit. Get help right away if:  You develop shortness of breath or difficulty breathing.  Your skin or nails turn a bluish color.  You have severe pain or stiffness in your neck.  You develop a sudden headache or sudden pain in your face or ear.  You cannot eat or drink without vomiting. Summary  Influenza, more commonly known as "the flu," is a viral infection that primarily affects your respiratory  tract.  Symptoms of the flu usually begin suddenly and last 4-14 days.  Getting an annual flu shot is the best way to prevent getting the flu.  Stay home from work or school as told by your health care provider. Unless you are visiting your health care provider, avoid leaving home until your fever has been gone for 24 hours without taking medicine.  Keep all follow-up visits as told by your health care provider. This is important. This information is not intended to replace advice given to you by your health care provider. Make sure you discuss any questions you have with your health care provider. Document Released: 09/05/2000 Document Revised: 02/24/2018 Document Reviewed: 02/24/2018 Elsevier Interactive Patient Education  2019 [employer].

## 2018-11-23 NOTE — Progress Notes (Signed)
MRN: [id number] DOB: 1990/12/13  Subjective:   Dylan Rose is a 28 y.o. male presenting for chief complaint of Fever (STARTED TODAY ); Cough (2 DAYS); Nasal Congestion (2 DAYS); Headache (STARTED TODAY); and Back Pain (STARTED TODAY ) .  Reports a  2 day history of dry cough, fever, nasal congestion, headache and body aches started today,  fatigue and malaise. Has tried Micinex relief. Denies ear fullness, ear drainage, sore throat, difficulty swallowing, pain with swallowing, inability to swallow, productive cough, wheezing, shortness of breath and chest tightness, decreased appetite, weight loss, nausea, vomiting, abdominal pain and diarrhea. Has not had sick contact with influenza. Denies history of seasonal allergies, history of asthma. Patient has not had a flu shot this season. Denies smoking.  Denies any other aggravating or relieving factors, no other questions or concerns.  Informed patient that a clinical diagnosis can be made for influenza, but patient is requesting a flu test for confirmation.   Review of Systems  Constitutional: Positive for fever and malaise/fatigue.  HENT: Positive for congestion and sinus pain. Negative for ear discharge, ear pain and sore throat.   Eyes: Negative.   Respiratory: Positive for cough. Negative for sputum production, shortness of breath and wheezing.   Cardiovascular: Negative.   Gastrointestinal: Negative.   Skin: Negative.   Neurological: Positive for headaches.    Dylan Rose has a current medication list which includes the following prescription(s): guaifenesin, amoxicillin, and oseltamivir. Also is allergic to shellfish allergy.  Dylan Rose  has a past medical history of Medical history non-contributory. Also  has a past surgical history that includes Fracture surgery; ORIF humerus fracture (Left, 06/30/2017); Elbow fracture surgery (Left, 2005); Wisdom tooth extraction; and Shoulder hemi-arthroplasty (Left, 01/28/2018).   Objective:    Vitals: BP 120/80 (BP Location: Right Arm, Patient Position: Sitting, Cuff Size: Large)   Pulse (!) 129   Temp (!) 100.4 F (38 C) (Oral)   Resp 20   Wt (!) 403 lb 6.4 oz (183 kg)   SpO2 97%   BMI 50.42 kg/m   Physical Exam Vitals signs reviewed.  Constitutional:      General: He is not in acute distress. HENT:     Head: Normocephalic.     Right Ear: Tympanic membrane, ear canal and external ear normal.     Left Ear: Tympanic membrane, ear canal and external ear normal.     Nose: Mucosal edema, congestion (moderate) and rhinorrhea (clear drainage) present.     Right Turbinates: Enlarged and swollen.     Left Turbinates: Enlarged and swollen.     Right Sinus: Maxillary sinus tenderness and frontal sinus tenderness present.     Left Sinus: Maxillary sinus tenderness and frontal sinus tenderness present.     Mouth/Throat:     Lips: Pink.     Mouth: Mucous membranes are moist.     Pharynx: No pharyngeal swelling, oropharyngeal exudate, posterior oropharyngeal erythema or uvula swelling.     Tonsils: No tonsillar exudate.  Eyes:     Pupils: Pupils are equal, round, and reactive to light.  Neck:     Musculoskeletal: Normal range of motion and neck supple.  Cardiovascular:     Rate and Rhythm: Regular rhythm. Tachycardia present.     Heart sounds: Normal heart sounds.  Pulmonary:     Effort: Pulmonary effort is normal. No respiratory distress.     Breath sounds: Normal breath sounds. No stridor. No wheezing, rhonchi or rales.  Abdominal:     General: Bowel  sounds are normal.     Palpations: Abdomen is soft.     Tenderness: There is no abdominal tenderness.  Lymphadenopathy:     Cervical: No cervical adenopathy.  Skin:    General: Skin is warm and dry.     Capillary Refill: Capillary refill takes less than 2 seconds.  Neurological:     General: No focal deficit present.     Mental Status: He is alert and oriented to person, place, and time.     Cranial Nerves: No  cranial nerve deficit.  Psychiatric:        Mood and Affect: Mood normal.        Thought Content: Thought content normal.    Results for orders placed or performed in visit on 11/23/18 (from the past 24 hour(s))  POCT Influenza A/B     Status: Normal   Collection Time: 11/23/18  4:12 PM  Result Value Ref Range   Influenza A, POC Negative Negative   Influenza B, POC Negative Negative     Assessment and Plan :   Exam findings, diagnosis etiology and medication use and indications reviewed with patient. Follow- Up and discharge instructions provided. No emergent/urgent issues found on exam. Based on the patient's clinical presentation, symptoms and physical assessment, patient's findings correlate with influenza.  An influenza test was performed at the patient's request, which resulted negative.  Prior to the test, I informed the patient that the results would not change the treatment.  Patient would like to start an antiviral, he would like to try [place].  Also providing the patient with symptomatic treatment to include fluticasone and promethazine DM.  Patient was provided Ibuprofen and oral hydration in the clinic with good response, temp down to 99.6 and HR down to 108.  Patient is well-appearing, in no acute distress and lungs CTAB. I feel comfortable discharging this patient to home.  Patient education was provided. Patient verbalized understanding of information provided and agrees with plan of care (POC), all questions answered. The patient is advised to call or return to clinic if condition does not see an improvement in symptoms, or to seek the care of the closest emergency department if condition worsens with the above plan.   1. Fever, unspecified fever cause  - ibuprofen (ADVIL,MOTRIN) tablet 800 mg  2. Flu-like symptoms  - POCT Influenza A/B  3. Influenza-like symptoms  - Baloxavir Marboxil,80 MG Dose, (XOFLUZA) 2 x 40 MG TBPK; Take 80 mg by mouth once for 1 dose.  Dispense:  1 each; Refill: 0 - promethazine-dextromethorphan (PROMETHAZINE-DM) 6.25-15 MG/5ML syrup; Take 5 mLs by mouth 4 (four) times daily as needed for up to 7 days.  Dispense: 140 mL; Refill: 0 - fluticasone (FLONASE) 50 MCG/ACT nasal spray; Place 2 sprays into both nostrils daily for 10 days.  Dispense: 16 g; Refill: 0 -Take medication as prescribed. -Ibuprofen or Tylenol for pain, fever, or general discomfort. -Increase fluids. -Get plenty of rest. -Sleep elevated on at least 2 pillows at bedtime to help with cough. -Use a humidifier or vaporizer when at home and during sleep. -May use a teaspoon of honey or over-the-counter cough drops to help with cough. -May use normal saline nasal spray to help with nasal congestion throughout the day. -Remain home until you have been fever-free for at least 48 hours. -Follow-up with your PCP if your symptoms do not improve within 48 hours.

## 2018-11-25 ENCOUNTER — Telehealth: Payer: Self-pay

## 2018-11-25 NOTE — Telephone Encounter (Signed)
Called and spoke with pt and he states that he is getting better.

## 2018-12-07 MED FILL — OFLOXACIN 0.3% EAR DROPS: 0.3 | 10 days supply | Qty: 5 | Fill #0

## 2019-03-16 DIAGNOSIS — Z96612 Presence of left artificial shoulder joint: Secondary | ICD-10-CM | POA: Diagnosis not present

## 2019-03-16 DIAGNOSIS — M25512 Pain in left shoulder: Secondary | ICD-10-CM | POA: Diagnosis not present

## 2020-03-07 ENCOUNTER — Encounter: Payer: Self-pay | Admitting: Allergy

## 2020-03-07 ENCOUNTER — Other Ambulatory Visit: Payer: Self-pay

## 2020-03-07 ENCOUNTER — Ambulatory Visit (INDEPENDENT_AMBULATORY_CARE_PROVIDER_SITE_OTHER): Payer: 59 | Admitting: Allergy

## 2020-03-07 VITALS — BP 116/72 | HR 82 | Temp 97.9°F | Resp 16 | Ht 75.0 in | Wt 396.0 lb

## 2020-03-07 DIAGNOSIS — J310 Chronic rhinitis: Secondary | ICD-10-CM

## 2020-03-07 DIAGNOSIS — T7800XA Anaphylactic reaction due to unspecified food, initial encounter: Secondary | ICD-10-CM | POA: Diagnosis not present

## 2020-03-07 MED ORDER — EPINEPHRINE 0.3 MG/0.3ML IJ SOAJ: 0.3000 mg | Freq: Once | INTRAMUSCULAR | 1 refills | Status: AC

## 2020-03-07 NOTE — Progress Notes (Signed)
New Patient Note  RE: Dylan Rose MRN: [id number] DOB: 31-May-1991 Date of Office Visit: 03/07/2020  Referring provider: No ref. provider found Primary care provider: Patient, No Pcp Per  Chief Complaint: food allergy  History of present illness: Dylan Rose is a 29 y.o. male presenting today for evaluation of food allergy.   He states he was eating all types of seafood like crab, lobster, mussels etc before.  He states he had an episode about 2 years ago where he felt like his throat was closing after eating seafood.  He states wasn't sure if this was in his head or an actual reaction and thus he tried eating shellfish individually.  He states every time he ate shellfish individually he again noted a throat closing sensation.  This happened last with shrimp.  He has been avoiding shellfish and fish to be safe for the past year.   He states with the throat closing sensation it felt like he had trouble swallowing but denies difficulty breathing.  Denies any cutaneous, GI or CV related symptoms as well.    He states he has had more of an issue with environmental allergies with cough, sinus drainage.  He reports normally doesn't take any medication for these symptoms.   No history eczema or asthma.    Review of systems: Review of Systems  Constitutional: Negative.   HENT: Negative.   Eyes: Negative.   Respiratory: Negative.   Cardiovascular: Negative.   Gastrointestinal: Negative.   Musculoskeletal: Negative.   Skin: Negative.   Neurological: Negative.     All other systems negative unless noted above in HPI  Past medical history: Medical history reviewed.  History of shoulder/arm injury  Past surgical history: Past Surgical History:  Procedure Laterality Date  . ELBOW FRACTURE SURGERY Left 2005  . FRACTURE SURGERY    . ORIF HUMERUS FRACTURE Left 06/30/2017   Procedure: OPEN REDUCTION INTERNAL FIXATION (ORIF) PROXIMAL HUMERUS FRACTURE;  Surgeon:  Nicholes Stairs, MD;  Location: [facility];  Service: Orthopedics;  Laterality: Left;  180 mins  . SHOULDER HEMI-ARTHROPLASTY Left 01/28/2018   Procedure: LEFT SHOULDER HARDWARE REMOVAL AND HEMIARTHROPLASTY;  Surgeon: Tania Ade, MD;  Location: [facility];  Service: Orthopedics;  Laterality: Left;  . WISDOM TOOTH EXTRACTION      Family history:  Family History  Problem Relation Age of Onset  . Diabetes Father   . Cancer Maternal Grandfather   . Cancer Paternal Grandfather   . Diabetes Paternal Grandfather   . Allergic rhinitis Neg Hx   . Angioedema Neg Hx   . Asthma Neg Hx   . Atopy Neg Hx   . Immunodeficiency Neg Hx   . Eczema Neg Hx   . Urticaria Neg Hx     Social history: Lives in a home with carpeting with gas heating and central cooling.  No pets in the home.  No concern for water damage, mildew or roaches in the home.  He is a Cytogeneticist.  Denies smoking history.   Medication List: Current Outpatient Medications  Medication Sig Dispense Refill  . EPINEPHrine 0.3 mg/0.3 mL IJ SOAJ injection Inject 0.3 mLs (0.3 mg total) into the muscle once for 1 dose. As needed for life-threatening allergic reactions 2 each 1   Current Facility-Administered Medications  Medication Dose Route Frequency Provider Last Rate Last Admin  . ibuprofen (ADVIL,MOTRIN) tablet 800 mg  800 mg Oral Once Kara Dies, NP  Known medication allergies: Allergies  Allergen Reactions  . Shellfish Allergy Swelling    Facial swelling     Physical examination: Blood pressure 116/72, pulse 82, temperature 97.9 F (36.6 C), temperature source Temporal, resp. rate 16, height 6\' 3"  (1.905 m), weight (!) 396 lb (179.6 kg), SpO2 97 %.  General: Alert, interactive, in no acute distress. HEENT: PERRLA, TMs pearly gray, turbinates non-edematous without discharge, post-pharynx non erythematous. Neck: Supple without lymphadenopathy. Lungs: Clear to auscultation without wheezing, rhonchi or  rales. {no increased work of breathing. CV: Normal S1, S2 without murmurs. Abdomen: Nondistended, nontender. Skin: Warm and dry, without lesions or rashes. Extremities:  No clubbing, cyanosis or edema. Neuro:   Grossly intact.  Diagnositics/Labs: Allergy testing: select food allergy skin prick testing is negative to fish and shellfish panel with positive histamine control.   Allergy testing results were read and interpreted by provider, documented by clinical staff.   Assessment and plan:   Food allergy  - skin testing to fish and shellfish is negative.  Will obtain serum IgE to fish and shellfish panels.   If skin testing and blood testing is negative then will recommend an in-office food challenge.    - continue avoidance of fish and shellfish for now   - have access to self-injectable epinephrine (Epipen or AuviQ) 0.3mg  in case of allergic reaction  - follow emergency action plan in case of allergic reaction  Rhinitis  - if interested in environmental allergy testing in future can schedule for this  - can take a long-acting antihistamine like Zyrtec 10mg , Allegra 180mg  or Xyzal 5mg  daily as needed for general allergy symptoms (ie. sneezing, nasal congestion/drainage, itch)  - if having nasal congestion/drainage can use a nasal steroid spray like Flonase, Rhinocort or Nasacort  - if having itchy/watery/red eyes can use Pataday 1 drop each eye daily as needed  - all of the above medications are over-the-counter  Follow-up in 1 year or sooner if needed  I appreciate the opportunity to take part in Dylan Rose's care. Please do not hesitate to contact me with questions.  Sincerely,   , MD Allergy/Immunology [facility]

## 2020-03-07 NOTE — Patient Instructions (Addendum)
Food allergy  - skin testing to fish and shellfish is negative.  Will obtain serum IgE to fish and shellfish panels.   If skin testing and blood testing is negative then will recommend an in-office food challenge.    - continue avoidance of fish and shellfish for now   - have access to self-injectable epinephrine (Epipen or AuviQ) 0.3mg  in case of allergic reaction  - follow emergency action plan in case of allergic reaction  Seasonal allergies  - if interested in environmental allergy testing in future can schedule for this  - can take a long-acting antihistamine like Zyrtec 10mg , Allegra 180mg  or Xyzal 5mg  daily as needed for general allergy symptoms (ie. sneezing, nasal congestion/drainage, itch)  - if having nasal congestion/drainage can use a nasal steroid spray like Flonase, Rhinocort or Nasacort  - if having itchy/watery/red eyes can use Pataday 1 drop each eye daily as needed  - all of the above medications are over-the-counter  Follow-up in 1 year or sooner if needed

## 2020-03-12 LAB — ALLERGEN PROFILE, FOOD-FISH
Allergen Mackerel IgE: <0.1 kU/L
Allergen Salmon IgE: <0.1 kU/L
Allergen Trout IgE: <0.1 kU/L
Allergen Walley Pike IgE: <0.1 kU/L
Codfish IgE: <0.1 kU/L
Halibut IgE: <0.1 kU/L
Tuna: <0.1 kU/L

## 2020-03-12 LAB — ALLERGEN PROFILE, SHELLFISH
Clam IgE: <0.1 kU/L
F023-IgE Crab: <0.1 kU/L
F080-IgE Lobster: <0.1 kU/L
F290-IgE Oyster: <0.1 kU/L
Scallop IgE: <0.1 kU/L
Shrimp IgE: <0.1 kU/L

## 2020-03-19 ENCOUNTER — Telehealth: Payer: 59 | Admitting: Emergency Medicine

## 2020-03-19 DIAGNOSIS — H9202 Otalgia, left ear: Secondary | ICD-10-CM | POA: Diagnosis not present

## 2020-03-19 MED ORDER — CIPROFLOXACIN-HYDROCORTISONE 0.2-1 % OT SUSP: 3.0000 [drp] | Freq: Two times a day (BID) | OTIC | 0 refills | Status: AC

## 2020-03-19 NOTE — Progress Notes (Signed)
E Visit for Swimmer's Ear  We are sorry that you are not feeling well. Here is how we plan to help!  I have prescribed: Ciprofloxin 0.2% and hydrocortisone 1% otic suspension 3 drops in affected ears twice daily for 7 days   In certain cases swimmer's ear may progress to a more serious bacterial infection of the middle or inner ear.  If you have a fever 102 and up and significantly worsening symptoms, this could indicate a more serious infection moving to the middle/inner and needs face to face evaluation in an office by a provider.  Your symptoms should improve over the next 3 days and should resolve in about 7 days.  HOME CARE:   Wash your hands frequently.  Do not place the tip of the bottle on your ear or touch it with your fingers.  You can take Acetominophen 650 mg every 4-6 hours as needed for pain.  If pain is severe or moderate, you can apply a heating pad (set on low) or hot water bottle (wrapped in a towel) to outer ear for 20 minutes.  This will also increase drainage.  Avoid ear plugs  Do not use Q-tips  After showers, help the water run out by tilting your head to one side.  GET HELP RIGHT AWAY IF:   Fever is over 102.2 degrees.  You develop progressive ear pain or hearing loss.  Ear symptoms persist longer than 3 days after treatment.  MAKE SURE YOU:   Understand these instructions.  Will watch your condition.  Will get help right away if you are not doing well or get worse.  TO PREVENT SWIMMER'S EAR:  Use a bathing cap or custom fitted swim molds to keep your ears dry.  Towel off after swimming to dry your ears.  Tilt your head or pull your earlobes to allow the water to escape your ear canal.  If there is still water in your ears, consider using a hairdryer on the lowest setting.  Thank you for choosing an e-visit. Your e-visit answers were reviewed by a board certified advanced clinical practitioner to complete your personal care plan.  Depending upon the condition, your plan could have included both over the counter or prescription medications. Please review your pharmacy choice. Be sure that the pharmacy you have chosen is open so that you can pick up your prescription now.  If there is a problem you may message your provider in MyChart to have the prescription routed to another pharmacy. Your safety is important to [place]. If you have drug allergies check your prescription carefully.  For the next 24 hours, you can use MyChart to ask questions about today's visit, request a non-urgent call back, or ask for a work or school excuse from your e-visit provider. You will get an email in the next two days asking about your experience. I hope that your e-visit has been valuable and will speed your recovery.      Approximately 5 minutes was used in reviewing the patient's chart, questionnaire, prescribing medications, and documentation.

## 2020-03-20 MED ORDER — CIPROFLOXACIN-DEXAMETHASONE 0.3-0.1 % OT SUSP: 4.0000 [drp] | Freq: Two times a day (BID) | OTIC | 0 refills | Status: AC

## 2020-03-20 NOTE — Addendum Note (Signed)
Addended by: Roxy Horseman B on: 03/20/2020 08:04 AM   Modules accepted: Orders

## 2020-04-02 ENCOUNTER — Encounter: Payer: Self-pay | Admitting: Family

## 2020-04-02 ENCOUNTER — Ambulatory Visit (INDEPENDENT_AMBULATORY_CARE_PROVIDER_SITE_OTHER): Payer: 59 | Admitting: Family

## 2020-04-02 ENCOUNTER — Other Ambulatory Visit: Payer: Self-pay

## 2020-04-02 VITALS — BP 118/74 | HR 73 | Resp 17

## 2020-04-02 DIAGNOSIS — T7800XA Anaphylactic reaction due to unspecified food, initial encounter: Secondary | ICD-10-CM

## 2020-04-02 DIAGNOSIS — L299 Pruritus, unspecified: Secondary | ICD-10-CM | POA: Diagnosis not present

## 2020-04-02 NOTE — Patient Instructions (Addendum)
He was able to tolerate the shrimp food challenge today at the office without adverse signs or symptoms of an allergic reaction. Therefore, he has the same risk of systemic reaction associated with the consumption of shrimp products as the general population.  - Do not give any shrimp products for the next 24 hours. - Monitor for allergic symptoms such as rash, wheezing, diarrhea, swelling, and vomiting for the next 24 hours. If severe symptoms occur, treat with EpiPen injection and call 911. For less severe symptoms treat with Benadryl 4 teaspoonfuls every 4-6 hours and call the clinic.  - If no allergic symptoms are evident, reintroduce shrimp products into the diet, 1-2 servings a week. If he develops an allergic reaction to shrimp products, record what was eaten the amount eaten, preparation method, time from ingestion to reaction, and symptoms.   Avoid other fish and shellfish. In case of an allergic reaction, give Benadryl 4 teaspoonfuls every 4 hours, and if life-threatening symptoms occur, inject with EpiPen 0.3 mg.  Please let us know if this treatment plan is not working well for you. Schedule follow up appointment in 6 months

## 2020-04-02 NOTE — Progress Notes (Signed)
Follow Up Note  RE: Dylan Rose MRN: [id number] DOB: [birth year] Date of Office Visit: 04/02/2020  Referring provider: No ref. provider found Primary care provider: Patient, No Pcp Per  Chief Complaint:Food/Drug Challenge (Shellfish)   Assessment and Plan: Derl is a 29 y.o. male with: No problem-specific Assessment & Plan notes found for this encounter.  Return in about 6 months (around 10/03/2020), or if symptoms worsen or fail to improve.  Challenge food: shrimp Challenge as per protocol: Passed Total time: 2 hours 22 minutes  Do not eat challenge food for next 24 hours and monitor for hives, swelling, shortness of breath and dizziness. If you see these symptoms, use Benadryl for mild symptoms and epinephrine for more severe symptoms and call 911.  If no adverse symptoms in the next 24 hours, repeat the challenge food the next day and observe for 1 hour. If no adverse symptoms, can eat the food on regular basis.   History of Present Illness: I had the pleasure of seeing Dylan Rose for a follow up visit at the [facility] on 04/02/2020. He is a 29 y.o. male, who is being followed for food allergy. His previous allergy office visit was on 03/07/2020 with Dr. Delorse Lek. Today he is here for shrimp food challenge.   History of Reaction: About 2 years ago he was eating seafood that contained crab,shrimp, mussels, clams, and oysters and his throat began to feel itchy and felt swollen. He took Benadryl and was fine.  He denied any difficulty breathing, cardiovascular or gastrointestinal symptoms. He then tried each of these foods individually and had the same symptoms. Since that time he has been avoiding fish and shellfish. The last time he had a reaction was around 2 years ago.  Labs/skin testing: IgE to shrimp on 03/07/2020 was less than 0.10 kU/L and percutaneous skin testing was negative to shrimp on 03/07/2020. Interval History: Patient has not been  ill, he has not had any accidental exposures to the culprit food.   Recent/Current History: Pulmonary disease: no Cardiac disease: no Respiratory infection: no Rash: no Itch: no Swelling: no Cough: no Shortness of breath: no Runny/stuffy nose: no Itchy eyes: no Beta-blocker use: no  Patient/guardian was informed of the test procedure with verbalized understanding of the risk of anaphylaxis. Consent was signed.   Last antihistamine use: approximately 2 weeks ago Last beta-blocker use: not applicable  Medication List:  Current Outpatient Medications  Medication Sig Dispense Refill  . EPINEPHrine 0.3 mg/0.3 mL IJ SOAJ injection SMARTSIG:0.3 Milliliter(s) IM Once PRN     Current Facility-Administered Medications  Medication Dose Route Frequency Provider Last Rate Last Admin  . ibuprofen (ADVIL,MOTRIN) tablet 800 mg  800 mg Oral Once Benay Pike, NP        Allergies: Allergies  Allergen Reactions  . Shellfish Allergy Swelling    Facial swelling    I reviewed his past medical history, social history, family history, and environmental history and no significant changes have been reported from his previous visit.   Review of Systems  Constitutional: Negative for chills and fever.  HENT: Negative for postnasal drip, rhinorrhea and sinus pain.   Eyes: Negative for discharge and itching.  Cardiovascular: Negative for chest pain and palpitations.  Gastrointestinal: Negative for abdominal pain, diarrhea, nausea and vomiting.  Genitourinary: Negative for difficulty urinating and dysuria.  Allergic/Immunologic: Positive for food allergies.  Neurological: Negative for dizziness and headaches.    Objective: BP 118/74   Pulse 73  Resp 17   SpO2 97%  There is no height or weight on file to calculate BMI. Physical Exam Constitutional:      Appearance: Normal appearance.  HENT:     Head: Normocephalic and atraumatic.     Right Ear: Tympanic membrane, ear canal and  external ear normal.     Left Ear: Tympanic membrane, ear canal and external ear normal.     Nose: Nose normal.     Mouth/Throat:     Mouth: Mucous membranes are moist.     Pharynx: Oropharynx is clear.  Eyes:     Conjunctiva/sclera: Conjunctivae normal.  Cardiovascular:     Rate and Rhythm: Normal rate and regular rhythm.     Heart sounds: Normal heart sounds.  Pulmonary:     Effort: Pulmonary effort is normal.     Breath sounds: Normal breath sounds.     Comments: Lungs clear to auscultation. Skin:    General: Skin is warm and dry.  Neurological:     Mental Status: He is alert and oriented to person, place, and time.  Psychiatric:        Mood and Affect: Mood normal.        Behavior: Behavior normal.        Thought Content: Thought content normal.        Judgment: Judgment normal.     Diagnostic Open graded shrimp challenge: The patient was able to tolerate the challenge today without adverse signs or symptoms. Vital signs were stable throughout the challenge and observation period. He received multiple doses separated by 15 minutes, each of which was separated by vitals and a brief physical exam. He received the following doses: lip rub, 2 gm, 6gm, 26 gm, and 45 gm. He was monitored for 60 minutes following the last dose.   The patient had negative skin prick test and sIgE tests to shrimp and was able to tolerate the open graded oral challenge today without adverse signs or symptoms. Therefore, he has the same risk of systemic reaction associated with the consumption of shrimp as the general population.    Previous notes and tests were reviewed. The plan was reviewed with the patient/family, and all questions/concerned were addressed.  It was my pleasure to see Dylan Rose today and participate in his care. Please feel free to contact me with any questions or concerns.  Sincerely, Nehemiah Settle, FNP [facility]

## 2020-09-21 DIAGNOSIS — Z20822 Contact with and (suspected) exposure to covid-19: Secondary | ICD-10-CM | POA: Diagnosis not present

## 2020-11-06 ENCOUNTER — Telehealth: Payer: 59 | Admitting: Physician Assistant

## 2020-11-06 DIAGNOSIS — L259 Unspecified contact dermatitis, unspecified cause: Secondary | ICD-10-CM

## 2020-11-06 MED ORDER — CLOBETASOL PROPIONATE 0.05 % EX LOTN: 1.0000 "application " | TOPICAL_LOTION | Freq: Two times a day (BID) | CUTANEOUS | 0 refills | Status: DC

## 2020-11-06 NOTE — Progress Notes (Signed)
E Visit for Rash  We are sorry that you are not feeling well. Here is how we plan to help!  Based on what you shared with me it looks like you have contact dermatitis.  Contact dermatitis is a skin rash caused by something that touches the skin and causes irritation or inflammation.  Your skin may be red, swollen, dry, cracked, and itch.  The rash should go away in a few days but can last a few weeks.  If you get a rash, it's important to figure out what caused it so the irritant can be avoided in the future.  I recommend you take Benadryl 25 mg - 50 mg every 4 hours to control the symptoms but if they last over 24 hours it is best that you see an office based provider for follow up.  I have prescribed Clobetasol, a steroid cream, to help with itching.  If symptoms spread or worsen you will need a face to face evaluation.  I do recommend f/u with dermatology.   HOME CARE:   Take cool showers and avoid direct sunlight.  Apply cool compress or wet dressings.  Take a bath in an oatmeal bath.  Sprinkle content of one Aveeno packet under running faucet with comfortably warm water.  Bathe for 15-20 minutes, 1-2 times daily.  Pat dry with a towel. Do not rub the rash.  Use hydrocortisone cream.  Take an antihistamine like Benadryl for widespread rashes that itch.  The adult dose of Benadryl is 25-50 mg by mouth 4 times daily.  Caution:  This type of medication may cause sleepiness.  Do not drink alcohol, drive, or operate dangerous machinery while taking antihistamines.  Do not take these medications if you have prostate enlargement.  Read package instructions thoroughly on all medications that you take.  GET HELP RIGHT AWAY IF:   Symptoms don't go away after treatment.  Severe itching that persists.  If you rash spreads or swells.  If you rash begins to smell.  If it blisters and opens or develops a yellow-brown crust.  You develop a fever.  You have a sore throat.  You become  short of breath.  MAKE SURE YOU:  Understand these instructions. Will watch your condition. Will get help right away if you are not doing well or get worse.  Thank you for choosing an e-visit. Your e-visit answers were reviewed by a board certified advanced clinical practitioner to complete your personal care plan. Depending upon the condition, your plan could have included both over the counter or prescription medications. Please review your pharmacy choice. Be sure that the pharmacy you have chosen is open so that you can pick up your prescription now.  If there is a problem you may message your provider in MyChart to have the prescription routed to another pharmacy. Your safety is important to [place]. If you have drug allergies check your prescription carefully.  For the next 24 hours, you can use MyChart to ask questions about today's visit, request a non-urgent call back, or ask for a work or school excuse from your e-visit provider. You will get an email in the next two days asking about your experience. I hope that your e-visit has been valuable and will speed your recovery.     Greater than 5 minutes, yet less than 10 minutes of time have been spent researching, coordinating, and implementing care for this patient today

## 2020-11-14 DIAGNOSIS — L298 Other pruritus: Secondary | ICD-10-CM | POA: Diagnosis not present

## 2020-11-14 DIAGNOSIS — L508 Other urticaria: Secondary | ICD-10-CM | POA: Diagnosis not present

## 2020-12-12 DIAGNOSIS — L509 Urticaria, unspecified: Secondary | ICD-10-CM | POA: Diagnosis not present

## 2021-06-27 ENCOUNTER — Encounter: Payer: Self-pay | Admitting: Family Medicine

## 2021-06-27 ENCOUNTER — Ambulatory Visit: Payer: 59 | Admitting: Family Medicine

## 2021-06-27 DIAGNOSIS — R030 Elevated blood-pressure reading, without diagnosis of hypertension: Secondary | ICD-10-CM | POA: Diagnosis not present

## 2021-06-27 NOTE — Assessment & Plan Note (Signed)
I encouraged him to check blood pressures at home.  Follow low-sodium diet and incorporate aerobic exercise to help with blood pressure.  We will recheck this at his upcoming annual exam.  If this remains elevated we discussed that we may need to add medication.

## 2021-06-27 NOTE — Progress Notes (Signed)
Pape Parson IV - 30 y.o. male MRN [id number]  Date of birth: 1991-03-08  Subjective Chief Complaint  Patient presents with   Establish Care    HPI [place] is a 30 year old male here today for initial visit to establish care.  He has not had a regular primary care doctor in quite some time.  He does not have any chronic medical conditions he is aware of he does not take any long-term medications.  He plans to follow up for annual exam with labs.  His blood pressure is elevated today.  No known history of hypertension.  He has not had any symptoms related to this including chest pain, shortness of breath, palpitations, headache or vision changes.  ROS:  A comprehensive ROS was completed and negative except as noted per HPI  No Known Allergies  Past Medical History:  Diagnosis Date   Medical history non-contributory     Past Surgical History:  Procedure Laterality Date   ELBOW FRACTURE SURGERY Left 2005   FRACTURE SURGERY     ORIF HUMERUS FRACTURE Left 06/30/2017   Procedure: OPEN REDUCTION INTERNAL FIXATION (ORIF) PROXIMAL HUMERUS FRACTURE;  Surgeon: Yolonda Kida, MD;  Location: [facility] OR;  Service: Orthopedics;  Laterality: Left;  180 mins   SHOULDER HEMI-ARTHROPLASTY Left 01/28/2018   Procedure: LEFT SHOULDER HARDWARE REMOVAL AND HEMIARTHROPLASTY;  Surgeon: Jones Broom, MD;  Location: [facility] OR;  Service: Orthopedics;  Laterality: Left;   WISDOM TOOTH EXTRACTION      Social History   Socioeconomic History   Marital status: Married    Spouse name: Not on file   Number of children: Not on file   Years of education: Not on file   Highest education level: Not on file  Occupational History   Not on file  Tobacco Use   Smoking status: Never    Passive exposure: Never   Smokeless tobacco: Never  Vaping Use   Vaping Use: Never used  Substance and Sexual Activity   Alcohol use: Yes    Alcohol/week: 2.0 standard drinks    Types: 2 Cans of beer per week    Comment:  daily   Drug use: No   Sexual activity: Yes    Birth control/protection: Surgical  Other Topics Concern   Not on file  Social History Narrative   Not on file   Social Determinants of Health   Financial Resource Strain: Not on file  Food Insecurity: Not on file  Transportation Needs: Not on file  Physical Activity: Not on file  Stress: Not on file  Social Connections: Not on file    Family History  Problem Relation Age of Onset   Diabetes Father    Cancer Maternal Grandfather    Cancer Paternal Grandfather    Diabetes Paternal Grandfather    Allergic rhinitis Neg Hx    Angioedema Neg Hx    Asthma Neg Hx    Atopy Neg Hx    Immunodeficiency Neg Hx    Eczema Neg Hx    Urticaria Neg Hx     Health Maintenance  Topic Date Due   COVID-19 Vaccine (1) Never done   Hepatitis C Screening  Never done   INFLUENZA VACCINE  Never done   TETANUS/TDAP  01/28/2024   HIV Screening  Completed   HPV VACCINES  Aged Out     ----------------------------------------------------------------------------------------------------------------------------------------------------------------------------------------------------------------- Physical Exam BP (!) 159/79 (BP Location: Left Arm, Patient Position: Sitting, Cuff Size: Large)   Pulse 76   Temp 98 F (36.7  C)   Ht 6' 2.02" (1.88 m)   Wt (!) 379 lb 6.4 oz (172.1 kg)   SpO2 100%   BMI 48.69 kg/m   Physical Exam Constitutional:      Appearance: Normal appearance.  Eyes:     General: No scleral icterus. Cardiovascular:     Rate and Rhythm: Normal rate and regular rhythm.  Musculoskeletal:     Cervical back: Neck supple.  Neurological:     Mental Status: He is alert.  Psychiatric:        Mood and Affect: Mood normal.        Behavior: Behavior normal.     ------------------------------------------------------------------------------------------------------------------------------------------------------------------------------------------------------------------- Assessment and Plan  Elevated blood pressure reading in office without diagnosis of hypertension I encouraged him to check blood pressures at home.  Follow low-sodium diet and incorporate aerobic exercise to help with blood pressure.  We will recheck this at his upcoming annual exam.  If this remains elevated we discussed that we may need to add medication.   No orders of the defined types were placed in this encounter.   No follow-ups on file.    This visit occurred during the SARS-CoV-2 public health emergency.  Safety protocols were in place, including screening questions prior to the visit, additional usage of staff PPE, and extensive cleaning of exam room while observing appropriate contact time as indicated for disinfecting solutions.   requiringdegenerated

## 2021-06-27 NOTE — Patient Instructions (Addendum)
Nice to meet you today! Please schedule annual exam.  We'll complete labs at this visit.

## 2021-07-02 ENCOUNTER — Other Ambulatory Visit (HOSPITAL_COMMUNITY): Payer: Self-pay

## 2021-07-02 MED ORDER — CARESTART COVID-19 HOME TEST VI KIT
PACK | 0 refills | Status: DC
Start: 2021-07-02 — End: 2021-07-29
  Filled 2021-07-02: qty 4, 4d supply, fill #0

## 2021-07-29 ENCOUNTER — Other Ambulatory Visit: Payer: Self-pay

## 2021-07-29 ENCOUNTER — Ambulatory Visit (INDEPENDENT_AMBULATORY_CARE_PROVIDER_SITE_OTHER): Payer: 59 | Admitting: Family Medicine

## 2021-07-29 ENCOUNTER — Encounter: Payer: Self-pay | Admitting: Family Medicine

## 2021-07-29 VITALS — BP 138/85 | HR 79 | Ht 75.0 in | Wt 378.0 lb

## 2021-07-29 DIAGNOSIS — Z1159 Encounter for screening for other viral diseases: Secondary | ICD-10-CM | POA: Diagnosis not present

## 2021-07-29 DIAGNOSIS — Z13220 Encounter for screening for lipoid disorders: Secondary | ICD-10-CM

## 2021-07-29 DIAGNOSIS — Z0000 Encounter for general adult medical examination without abnormal findings: Secondary | ICD-10-CM

## 2021-07-29 DIAGNOSIS — Z23 Encounter for immunization: Secondary | ICD-10-CM

## 2021-07-29 NOTE — Assessment & Plan Note (Signed)
Well adult Orders Placed This Encounter  Procedures  . Flu Vaccine QUAD 6+ mos PF IM (Fluarix Quad PF)  . COMPLETE METABOLIC PANEL WITH GFR  . CBC with Differential  . Lipid Panel w/reflex Direct LDL  . Hepatitis C Antibody  Screenings: Lipid panel, hep C antibody Immunizations: Flu vaccine given today. Anticipatory guidance/risk factor reduction: Continue to work on dietary and lifestyle change.  Additional recommendations per AVS.

## 2021-07-29 NOTE — Patient Instructions (Signed)
Preventive Care 21-31 Years Old, Male ?Preventive care refers to lifestyle choices and visits with your health care provider that can promote health and wellness. Preventive care visits are also called wellness exams. ?What can I expect for my preventive care visit? ?Counseling ?During your preventive care visit, your health care provider may ask about your: ?Medical history, including: ?Past medical problems. ?Family medical history. ?Current health, including: ?Emotional well-being. ?Home life and relationship well-being. ?Sexual activity. ?Lifestyle, including: ?Alcohol, nicotine or tobacco, and drug use. ?Access to firearms. ?Diet, exercise, and sleep habits. ?Safety issues such as seatbelt and bike helmet use. ?Sunscreen use. ?Work and work environment. ?Physical exam ?Your health care provider may check your: ?Height and weight. These may be used to calculate your BMI (body mass index). BMI is a measurement that tells if you are at a healthy weight. ?Waist circumference. This measures the distance around your waistline. This measurement also tells if you are at a healthy weight and may help predict your risk of certain diseases, such as type 2 diabetes and high blood pressure. ?Heart rate and blood pressure. ?Body temperature. ?Skin for abnormal spots. ?What immunizations do I need? ?Vaccines are usually given at various ages, according to a schedule. Your health care provider will recommend vaccines for you based on your age, medical history, and lifestyle or other factors, such as travel or where you work. ?What tests do I need? ?Screening ?Your health care provider may recommend screening tests for certain conditions. This may include: ?Lipid and cholesterol levels. ?Diabetes screening. This is done by checking your blood sugar (glucose) after you have not eaten for a while (fasting). ?Hepatitis B test. ?Hepatitis C test. ?HIV (human immunodeficiency virus) test. ?STI (sexually transmitted infection)  testing, if you are at risk. ?Talk with your health care provider about your test results, treatment options, and if necessary, the need for more tests. ?Follow these instructions at home: ?Eating and drinking ? ?Eat a healthy diet that includes fresh fruits and vegetables, whole grains, lean protein, and low-fat dairy products. ?Drink enough fluid to keep your urine pale yellow. ?Take vitamin and mineral supplements as recommended by your health care provider. ?Do not drink alcohol if your health care provider tells you not to drink. ?If you drink alcohol: ?Limit how much you have to 0-2 drinks a day. ?Know how much alcohol is in your drink. In the [place] one drink equals one 12 oz bottle of beer (355 mL), one 5 oz glass of wine (148 mL), or one 1? oz glass of hard liquor (44 mL). ?Lifestyle ?Brush your teeth every morning and night with fluoride toothpaste. Floss one time each day. ?Exercise for at least 30 minutes 5 or more days each week. ?Do not use any products that contain nicotine or tobacco. These products include cigarettes, chewing tobacco, and vaping devices, such as e-cigarettes. If you need help quitting, ask your health care provider. ?Do not use drugs. ?If you are sexually active, practice safe sex. Use a condom or other form of protection to prevent STIs. ?Find healthy ways to manage stress, such as: ?Meditation, yoga, or listening to music. ?Journaling. ?Talking to a trusted person. ?Spending time with friends and family. ?Minimize exposure to UV radiation to reduce your risk of skin cancer. ?Safety ?Always wear your seat belt while driving or riding in a vehicle. ?Do not drive: ?If you have been drinking alcohol. Do not ride with someone who has been drinking. ?If you have been using any mind-altering substances or   drugs. ?While texting. ?When you are tired or distracted. ?Wear a helmet and other protective equipment during sports activities. ?If you have firearms in your house, make sure you  follow all gun safety procedures. ?Seek help if you have been physically or sexually abused. ?What's next? ?Go to your health care provider once a year for an annual wellness visit. ?Ask your health care provider how often you should have your eyes and teeth checked. ?Stay up to date on all vaccines. ?This information is not intended to replace advice given to you by your health care provider. Make sure you discuss any questions you have with your health care provider. ?Document Revised: 03/06/2021 Document Reviewed: 03/06/2021 ?Elsevier Patient Education ? 2022 Elsevier Inc[employer] ? ?

## 2021-07-29 NOTE — Progress Notes (Signed)
Dylan Rose - 30 y.o. male MRN [id number]  Date of birth: 03-20-1991  Subjective Chief Complaint  Patient presents with   Annual Exam    HPI Dylan Rose is a 30 year old male here today for annual exam.  Reports overall he is doing well.  Blood pressure elevated at previous visits but is better controlled at this time.  He has been working on some dietary changes over the past several weeks.  He does exercise occasionally.  He is a non-smoker.  He does consume alcohol a couple days per week.  Would like to have flu shot and updated labs today.  Review of Systems  Constitutional:  Negative for chills, fever, malaise/fatigue and weight loss.  HENT:  Negative for congestion, ear pain and sore throat.   Eyes:  Negative for blurred vision, double vision and pain.  Respiratory:  Negative for cough and shortness of breath.   Cardiovascular:  Negative for chest pain and palpitations.  Gastrointestinal:  Negative for abdominal pain, blood in stool, constipation, heartburn and nausea.  Genitourinary:  Negative for dysuria and urgency.  Musculoskeletal:  Negative for joint pain and myalgias.  Neurological:  Negative for dizziness and headaches.  Endo/Heme/Allergies:  Does not bruise/bleed easily.  Psychiatric/Behavioral:  Negative for depression. The patient is not nervous/anxious and does not have insomnia.    No Known Allergies  Past Medical History:  Diagnosis Date   Medical history non-contributory     Past Surgical History:  Procedure Laterality Date   ELBOW FRACTURE SURGERY Left 2005   FRACTURE SURGERY     ORIF HUMERUS FRACTURE Left 06/30/2017   Procedure: OPEN REDUCTION INTERNAL FIXATION (ORIF) PROXIMAL HUMERUS FRACTURE;  Surgeon: Yolonda Kida, MD;  Location: [facility] OR;  Service: Orthopedics;  Laterality: Left;  180 mins   SHOULDER HEMI-ARTHROPLASTY Left 01/28/2018   Procedure: LEFT SHOULDER HARDWARE REMOVAL AND HEMIARTHROPLASTY;  Surgeon: Jones Broom, MD;  Location:  [facility] OR;  Service: Orthopedics;  Laterality: Left;   WISDOM TOOTH EXTRACTION      Social History   Socioeconomic History   Marital status: Married    Spouse name: Not on file   Number of children: Not on file   Years of education: Not on file   Highest education level: Not on file  Occupational History   Not on file  Tobacco Use   Smoking status: Never    Passive exposure: Never   Smokeless tobacco: Never  Vaping Use   Vaping Use: Never used  Substance and Sexual Activity   Alcohol use: Yes    Alcohol/week: 2.0 standard drinks    Types: 2 Cans of beer per week    Comment: daily   Drug use: No   Sexual activity: Yes    Birth control/protection: Surgical  Other Topics Concern   Not on file  Social History Narrative   Not on file   Social Determinants of Health   Financial Resource Strain: Not on file  Food Insecurity: Not on file  Transportation Needs: Not on file  Physical Activity: Not on file  Stress: Not on file  Social Connections: Not on file    Family History  Problem Relation Age of Onset   Diabetes Father    Cancer Maternal Grandfather    Cancer Paternal Grandfather    Diabetes Paternal Grandfather    Allergic rhinitis Neg Hx    Angioedema Neg Hx    Asthma Neg Hx    Atopy Neg Hx    Immunodeficiency Neg Hx  Eczema Neg Hx    Urticaria Neg Hx     Health Maintenance  Topic Date Due   COVID-19 Vaccine (1) Never done   Hepatitis C Screening  Never done   TETANUS/TDAP  01/28/2024   INFLUENZA VACCINE  Completed   HIV Screening  Completed   Pneumococcal Vaccine 82-52 Years old  Aged Out   HPV VACCINES  Aged Out     ----------------------------------------------------------------------------------------------------------------------------------------------------------------------------------------------------------------- Physical Exam BP 138/85 (BP Location: Left Arm, Patient Position: Sitting, Cuff Size: Large)   Pulse 79   Ht 6\' 3"  (1.905 m)    Wt (!) 378 lb (171.5 kg)   SpO2 99%   BMI 47.25 kg/m   Physical Exam Constitutional:      General: He is not in acute distress. HENT:     Head: Normocephalic and atraumatic.     Right Ear: Tympanic membrane and external ear normal.     Left Ear: Tympanic membrane and external ear normal.  Eyes:     General: No scleral icterus. Neck:     Thyroid: No thyromegaly.  Cardiovascular:     Rate and Rhythm: Normal rate and regular rhythm.     Heart sounds: Normal heart sounds.  Pulmonary:     Effort: Pulmonary effort is normal.     Breath sounds: Normal breath sounds.  Abdominal:     General: Bowel sounds are normal. There is no distension.     Palpations: Abdomen is soft.     Tenderness: There is no abdominal tenderness. There is no guarding.  Musculoskeletal:     Cervical back: Normal range of motion.  Lymphadenopathy:     Cervical: No cervical adenopathy.  Skin:    General: Skin is warm and dry.     Findings: No rash.  Neurological:     Mental Status: He is alert and oriented to person, place, and time.     Cranial Nerves: No cranial nerve deficit.     Motor: No abnormal muscle tone.  Psychiatric:        Mood and Affect: Mood normal.        Behavior: Behavior normal.    ------------------------------------------------------------------------------------------------------------------------------------------------------------------------------------------------------------------- Assessment and Plan  Well adult exam Well adult Orders Placed This Encounter  Procedures   Flu Vaccine QUAD 6+ mos PF IM (Fluarix Quad PF)   COMPLETE METABOLIC PANEL WITH GFR   CBC with Differential   Lipid Panel w/reflex Direct LDL   Hepatitis C Antibody  Screenings: Lipid panel, hep C antibody Immunizations: Flu vaccine given today. Anticipatory guidance/risk factor reduction: Continue to work on dietary and lifestyle change.  Additional recommendations per AVS.   No orders of the  defined types were placed in this encounter.   No follow-ups on file.    This visit occurred during the SARS-CoV-2 public health emergency.  Safety protocols were in place, including screening questions prior to the visit, additional usage of staff PPE, and extensive cleaning of exam room while observing appropriate contact time as indicated for disinfecting solutions.

## 2021-07-30 LAB — COMPLETE METABOLIC PANEL WITH GFR
AG Ratio: 2 (calc) (ref 1.0–2.5)
ALT: 20 U/L (ref 9–46)
AST: 13 U/L (ref 10–40)
Albumin: 4.7 g/dL (ref 3.6–5.1)
Alkaline phosphatase (APISO): 47 U/L (ref 36–130)
BUN: 9 mg/dL (ref 7–25)
CO2: 27 mmol/L (ref 20–32)
Calcium: 9.6 mg/dL (ref 8.6–10.3)
Chloride: 104 mmol/L (ref 98–110)
Creat: 0.64 mg/dL (ref 0.60–1.26)
Globulin: 2.3 g/dL (calc) (ref 1.9–3.7)
Glucose, Bld: 89 mg/dL (ref 65–99)
Potassium: 4.2 mmol/L (ref 3.5–5.3)
Sodium: 138 mmol/L (ref 135–146)
Total Bilirubin: 0.5 mg/dL (ref 0.2–1.2)
Total Protein: 7 g/dL (ref 6.1–8.1)
eGFR: 130 mL/min/{1.73_m2} (ref >=60)

## 2021-07-30 LAB — CBC WITH DIFFERENTIAL/PLATELET
Absolute Monocytes: 630 cells/uL (ref 200–950)
Basophils Absolute: 72 cells/uL (ref 0–200)
Basophils Relative: 0.8 %
Eosinophils Absolute: 162 cells/uL (ref 15–500)
Eosinophils Relative: 1.8 %
HCT: 47.4 % (ref 38.5–50.0)
Hemoglobin: 16.1 g/dL (ref 13.2–17.1)
Lymphs Abs: 2880 cells/uL (ref 850–3900)
MCH: 28.8 pg (ref 27.0–33.0)
MCHC: 34 g/dL (ref 32.0–36.0)
MCV: 84.6 fL (ref 80.0–100.0)
MPV: 12 fL (ref 7.5–12.5)
Monocytes Relative: 7 %
Neutro Abs: 5256 cells/uL (ref 1500–7800)
Neutrophils Relative %: 58.4 %
Platelets: 319 10*3/uL (ref 140–400)
RBC: 5.6 10*6/uL (ref 4.20–5.80)
RDW: 11.8 % (ref 11.0–15.0)
Total Lymphocyte: 32 %
WBC: 9 10*3/uL (ref 3.8–10.8)

## 2021-07-30 LAB — LIPID PANEL W/REFLEX DIRECT LDL
Cholesterol: 180 mg/dL (ref <200)
HDL: 41 mg/dL (ref >=40)
LDL Cholesterol (Calc): 122 mg/dL (calc) — ABNORMAL HIGH
Non-HDL Cholesterol (Calc): 139 mg/dL (calc) — ABNORMAL HIGH (ref <130)
Total CHOL/HDL Ratio: 4.4 (calc) (ref <5.0)
Triglycerides: 75 mg/dL (ref <150)

## 2021-07-30 LAB — HEPATITIS C ANTIBODY
Hepatitis C Ab: NONREACTIVE
SIGNAL TO CUT-OFF: 0.06 (ref <1.00)

## 2021-11-27 ENCOUNTER — Telehealth: Payer: 59 | Admitting: Physician Assistant

## 2021-11-27 DIAGNOSIS — R058 Other specified cough: Secondary | ICD-10-CM

## 2021-11-27 MED ORDER — BENZONATATE 100 MG PO CAPS
100.0000 mg | ORAL_CAPSULE | Freq: Three times a day (TID) | ORAL | 0 refills | Status: DC | PRN
  Filled 2021-11-27: qty 30, 10d supply, fill #0

## 2021-11-27 NOTE — Progress Notes (Signed)
I have spent 5 minutes in review of e-visit questionnaire, review and updating patient chart, medical decision making and response to patient.   Tery Hoeger Cody Jermain Curt, PA-C    

## 2021-11-27 NOTE — Progress Notes (Signed)
We are sorry that you are not feeling well.  Here is how we plan to help! ? ?Based on your presentation I believe you most likely have A cough due to a your recent COVID virus.  This is called viral bronchitis and is best treated by rest, plenty of fluids and control of the cough.  You may use Ibuprofen or Tylenol as directed to help your symptoms.   ?  ?In addition you may use A prescription cough medication called Tessalon Perles 100mg . You may take 1-2 capsules every 8 hours as needed for your cough. The prescription has been sent to your pharmacy. You can take this along with OTC Delsym or Mucinex-DM.  ? ? ?From your responses in the eVisit questionnaire you describe inflammation in the upper respiratory tract which is causing a significant cough.  This is commonly called Bronchitis and has four common causes:   ?Allergies ?Viral Infections ?Acid Reflux ?Bacterial Infection ?Allergies, viruses and acid reflux are treated by controlling symptoms or eliminating the cause. An example might be a cough caused by taking certain blood pressure medications. You stop the cough by changing the medication. Another example might be a cough caused by acid reflux. Controlling the reflux helps control the cough. ? ?USE OF BRONCHODILATOR ("RESCUE") INHALERS: ?There is a risk from using your bronchodilator too frequently.  The risk is that over-reliance on a medication which only relaxes the muscles surrounding the breathing tubes can reduce the effectiveness of medications prescribed to reduce swelling and congestion of the tubes themselves.  Although you feel brief relief from the bronchodilator inhaler, your asthma may actually be worsening with the tubes becoming more swollen and filled with mucus.  This can delay other crucial treatments, such as oral steroid medications. If you need to use a bronchodilator inhaler daily, several times per day, you should discuss this with your provider.  There are probably better  treatments that could be used to keep your asthma under control.  ?   ?HOME CARE ?Only take medications as instructed by your medical team. ?Complete the entire course of an antibiotic. ?Drink plenty of fluids and get plenty of rest. ?Avoid close contacts especially the very young and the elderly ?Cover your mouth if you cough or cough into your sleeve. ?Always remember to wash your hands ?A steam or ultrasonic humidifier can help congestion.  ? ?GET HELP RIGHT AWAY IF: ?You develop worsening fever. ?You become short of breath ?You cough up blood. ?Your symptoms persist after you have completed your treatment plan ?MAKE SURE YOU  ?Understand these instructions. ?Will watch your condition. ?Will get help right away if you are not doing well or get worse. ?  ? ?Thank you for choosing an e-visit. ? ?Your e-visit answers were reviewed by a board certified advanced clinical practitioner to complete your personal care plan. Depending upon the condition, your plan could have included both over the counter or prescription medications. ? ?Please review your pharmacy choice. Make sure the pharmacy is open so you can pick up prescription now. If there is a problem, you may contact your provider through and have the prescription routed to another pharmacy.  Your safety is important to [employer]. If you have drug allergies check your prescription carefully.  ? ?For the next 24 hours you can use MyChart to ask questions about today's visit, request a non-urgent call back, or ask for a work or school excuse. ?You will get an email in the next two days  asking about your experience. I hope that your e-visit has been valuable and will speed your recovery. ? ?

## 2021-11-28 ENCOUNTER — Other Ambulatory Visit (HOSPITAL_COMMUNITY): Payer: Self-pay

## 2021-12-11 ENCOUNTER — Other Ambulatory Visit (HOSPITAL_COMMUNITY): Payer: Self-pay

## 2022-06-23 ENCOUNTER — Other Ambulatory Visit (HOSPITAL_COMMUNITY): Payer: Self-pay

## 2022-06-23 ENCOUNTER — Ambulatory Visit (INDEPENDENT_AMBULATORY_CARE_PROVIDER_SITE_OTHER): Payer: 59 | Admitting: Sports Medicine

## 2022-06-23 VITALS — BP 130/80 | HR 82 | Ht 75.0 in | Wt >= 6400 oz

## 2022-06-23 DIAGNOSIS — M25571 Pain in right ankle and joints of right foot: Secondary | ICD-10-CM

## 2022-06-23 MED ORDER — MELOXICAM 15 MG PO TABS
15.0000 mg | ORAL_TABLET | Freq: Every day | ORAL | 0 refills | Status: DC
  Filled 2022-06-23: qty 14, 14d supply, fill #0

## 2022-06-23 NOTE — Patient Instructions (Addendum)
Good to see you  - Start meloxicam 15 mg daily x2 weeks.   Do not to use additional NSAIDs while taking meloxicam.  May use Tylenol [phone] mg 2 to 3 times a day for breakthrough pain. Ankle HEP As needed follow up

## 2022-06-23 NOTE — Progress Notes (Signed)
    Dylan Rose D.[place] Phone: [phone]   Assessment and Plan:     Acute right ankle pain -Acute, uncomplicated, initial sports medicine visit - 1 day of lateral right ankle pain without MOI.  Differential includes mild grade 1 lateral ankle sprain versus mild peroneal tendinitis. - No red flag symptoms and no significant MOI, so no imaging performed today - Start meloxicam 15 mg daily x2 weeks.  If still having pain after 2 weeks, complete 3rd-week of meloxicam. May use remaining meloxicam as needed once daily for pain control.  Do not to use additional NSAIDs while taking meloxicam.  May use Tylenol [phone] mg 2 to 3 times a day for breakthrough pain. - Start HEP for ankle   Pertinent previous records reviewed include none   Follow Up: As needed if no improvement or worsening of symptoms after [place] trip this weekend   Subjective:   I, Dylan Rose, am serving as a Education administrator for Doctor Glennon Mac'  Chief Complaint: right ankle pain   HPI:   06/23/22 Patient is a 31 year old male complaining of right ankle pain. Patient states that yesterday he had pain , felt weak , [place] he had an antalgic gait, going to [place] this weekend, took ib and that has helped, just wants to get it checked out, walks about 4-5 miles aday walking up and down hills is painful , no new footwear   Relevant Historical Information: None pertinent  Additional pertinent review of systems negative.   Current Outpatient Medications:    benzonatate (TESSALON) 100 MG capsule, Take 1 capsule by mouth 3  times daily as needed for cough., Disp: 30 capsule, Rfl: 0   Objective:     Vitals:   06/23/22 1049  BP: 130/80  Pulse: 82  SpO2: 98%  Weight: (!) 400 lb (181.4 kg)  Height: 6\' 3"  (1.905 m)      Body mass index is 50 kg/m.    Physical Exam:    Gen: Appears well, nad, nontoxic and  pleasant Psych: Alert and oriented, appropriate mood and affect Neuro: sensation intact, strength is 5/5 with df/pf/inv/ev, muscle tone wnl Skin: no susupicious lesions or rashes  Right ankle: no deformity, no swelling or effusion NTTP over fibular head, lat mal, medial mal, achilles, navicular, base of 5th, ATFL, CFL, deltoid, calcaneous or midfoot ROM DF 30, PF 45, inv/ev intact Negative ant drawer, talar tilt, rotation test, squeeze test. Neg thompson No pain with resisted inversion or eversion    Electronically signed by:  Dylan Rose D.[facility] 10:57 AM 06/23/22

## 2022-11-10 ENCOUNTER — Other Ambulatory Visit (HOSPITAL_COMMUNITY): Payer: Self-pay

## 2022-11-10 ENCOUNTER — Ambulatory Visit (INDEPENDENT_AMBULATORY_CARE_PROVIDER_SITE_OTHER): Payer: 59 | Admitting: Family Medicine

## 2022-11-10 ENCOUNTER — Encounter: Payer: Self-pay | Admitting: Family Medicine

## 2022-11-10 VITALS — BP 144/81 | HR 90 | Ht 75.0 in | Wt >= 6400 oz

## 2022-11-10 DIAGNOSIS — Z13220 Encounter for screening for lipoid disorders: Secondary | ICD-10-CM

## 2022-11-10 DIAGNOSIS — Z23 Encounter for immunization: Secondary | ICD-10-CM

## 2022-11-10 DIAGNOSIS — Z6841 Body Mass Index (BMI) 40.0 and over, adult: Secondary | ICD-10-CM

## 2022-11-10 DIAGNOSIS — Z0000 Encounter for general adult medical examination without abnormal findings: Secondary | ICD-10-CM | POA: Diagnosis not present

## 2022-11-10 DIAGNOSIS — E669 Obesity, unspecified: Secondary | ICD-10-CM | POA: Insufficient documentation

## 2022-11-10 MED ORDER — ZEPBOUND 5 MG/0.5ML ~~LOC~~ SOAJ
5.0000 mg | SUBCUTANEOUS | 0 refills | Status: DC
  Filled 2022-11-10 – 2022-11-19 (×2): qty 2, 28d supply, fill #0

## 2022-11-10 MED ORDER — ZEPBOUND 2.5 MG/0.5ML ~~LOC~~ SOAJ
2.5000 mg | SUBCUTANEOUS | 0 refills | Status: DC
  Filled 2022-11-10 – 2022-11-19 (×2): qty 2, 28d supply, fill #0

## 2022-11-10 MED ORDER — ZEPBOUND 7.5 MG/0.5ML ~~LOC~~ SOAJ
7.5000 mg | SUBCUTANEOUS | 1 refills | Status: DC
  Filled 2022-11-10 – 2022-11-20 (×2): qty 2, 28d supply, fill #0
  Filled 2022-12-09 – 2022-12-14 (×2): qty 2, 28d supply, fill #1

## 2022-11-10 NOTE — Assessment & Plan Note (Signed)
He has increased his physical activity.  Having trouble maintaining diet.  Adding zepbound, titrate based on tolerance and response.

## 2022-11-10 NOTE — Patient Instructions (Signed)
Preventive Care 65-33 Years Old, Male Preventive care refers to lifestyle choices and visits with your health care provider that can promote health and wellness. Preventive care visits are also called wellness exams. What can I expect for my preventive care visit? Counseling During your preventive care visit, your health care provider may ask about your: Medical history, including: Past medical problems. Family medical history. Current health, including: Emotional well-being. Home life and relationship well-being. Sexual activity. Lifestyle, including: Alcohol, nicotine or tobacco, and drug use. Access to firearms. Diet, exercise, and sleep habits. Safety issues such as seatbelt and bike helmet use. Sunscreen use. Work and work Statistician. Physical exam Your health care provider may check your: Height and weight. These may be used to calculate your BMI (body mass index). BMI is a measurement that tells if you are at a healthy weight. Waist circumference. This measures the distance around your waistline. This measurement also tells if you are at a healthy weight and may help predict your risk of certain diseases, such as type 2 diabetes and high blood pressure. Heart rate and blood pressure. Body temperature. Skin for abnormal spots. What immunizations do I need?  Vaccines are usually given at various ages, according to a schedule. Your health care provider will recommend vaccines for you based on your age, medical history, and lifestyle or other factors, such as travel or where you work. What tests do I need? Screening Your health care provider may recommend screening tests for certain conditions. This may include: Lipid and cholesterol levels. Diabetes screening. This is done by checking your blood sugar (glucose) after you have not eaten for a while (fasting). Hepatitis B test. Hepatitis C test. HIV (human immunodeficiency virus) test. STI (sexually transmitted infection)  testing, if you are at risk. Talk with your health care provider about your test results, treatment options, and if necessary, the need for more tests. Follow these instructions at home: Eating and drinking  Eat a healthy diet that includes fresh fruits and vegetables, whole grains, lean protein, and low-fat dairy products. Drink enough fluid to keep your urine pale yellow. Take vitamin and mineral supplements as recommended by your health care provider. Do not drink alcohol if your health care provider tells you not to drink. If you drink alcohol: Limit how much you have to 0-2 drinks a day. Know how much alcohol is in your drink. In the [place] one drink equals one 12 oz bottle of beer (355 mL), one 5 oz glass of wine (148 mL), or one 1 oz glass of hard liquor (44 mL). Lifestyle Brush your teeth every morning and night with fluoride toothpaste. Floss one time each day. Exercise for at least 30 minutes 5 or more days each week. Do not use any products that contain nicotine or tobacco. These products include cigarettes, chewing tobacco, and vaping devices, such as e-cigarettes. If you need help quitting, ask your health care provider. Do not use drugs. If you are sexually active, practice safe sex. Use a condom or other form of protection to prevent STIs. Find healthy ways to manage stress, such as: Meditation, yoga, or listening to music. Journaling. Talking to a trusted person. Spending time with friends and family. Minimize exposure to UV radiation to reduce your risk of skin cancer. Safety Always wear your seat belt while driving or riding in a vehicle. Do not drive: If you have been drinking alcohol. Do not ride with someone who has been drinking. If you have been using any mind-altering substances  or drugs. While texting. When you are tired or distracted. Wear a helmet and other protective equipment during sports activities. If you have firearms in your house, make sure you  follow all gun safety procedures. Seek help if you have been physically or sexually abused. What's next? Go to your health care provider once a year for an annual wellness visit. Ask your health care provider how often you should have your eyes and teeth checked. Stay up to date on all vaccines. This information is not intended to replace advice given to you by your health care provider. Make sure you discuss any questions you have with your health care provider. Document Revised: 03/06/2021 Document Reviewed: 03/06/2021 Elsevier Patient Education  [place].

## 2022-11-10 NOTE — Progress Notes (Signed)
Dylan Rose - 32 y.o. male MRN [id number]  Date of birth: 1991-03-15  Subjective Chief Complaint  Patient presents with   Annual Exam    HPI Dylan Rose is a 32 y.o. male here today for annual exam.   He reports that he is doing pretty well.   His activity level is moderate. He feels that his diet is not great.  Continues to gain weight.  He has increased his activity level and weight is still going up.  He would be interested in  adding medication to help with weight management.   He is a non-smoker.  He consumes EtOH occasionally.   He would like to have COVID and flu vaccine.   Review of Systems  Constitutional:  Negative for chills, fever, malaise/fatigue and weight loss.  HENT:  Negative for congestion, ear pain and sore throat.   Eyes:  Negative for blurred vision, double vision and pain.  Respiratory:  Negative for cough and shortness of breath.   Cardiovascular:  Negative for chest pain and palpitations.  Gastrointestinal:  Negative for abdominal pain, blood in stool, constipation, heartburn and nausea.  Genitourinary:  Negative for dysuria and urgency.  Musculoskeletal:  Negative for joint pain and myalgias.  Neurological:  Negative for dizziness and headaches.  Endo/Heme/Allergies:  Does not bruise/bleed easily.  Psychiatric/Behavioral:  Negative for depression. The patient is not nervous/anxious and does not have insomnia.     No Known Allergies  Past Medical History:  Diagnosis Date   Medical history non-contributory     Past Surgical History:  Procedure Laterality Date   ELBOW FRACTURE SURGERY Left 2005   FRACTURE SURGERY     ORIF HUMERUS FRACTURE Left 06/30/2017   Procedure: OPEN REDUCTION INTERNAL FIXATION (ORIF) PROXIMAL HUMERUS FRACTURE;  Surgeon: Nicholes Stairs, MD;  Location: [facility];  Service: Orthopedics;  Laterality: Left;  180 mins   SHOULDER HEMI-ARTHROPLASTY Left 01/28/2018   Procedure: LEFT SHOULDER HARDWARE REMOVAL AND  HEMIARTHROPLASTY;  Surgeon: Tania Ade, MD;  Location: [facility];  Service: Orthopedics;  Laterality: Left;   WISDOM TOOTH EXTRACTION      Social History   Socioeconomic History   Marital status: Married    Spouse name: Not on file   Number of children: Not on file   Years of education: Not on file   Highest education level: Not on file  Occupational History   Not on file  Tobacco Use   Smoking status: Never    Passive exposure: Never   Smokeless tobacco: Never  Vaping Use   Vaping Use: Never used  Substance and Sexual Activity   Alcohol use: Yes    Alcohol/week: 2.0 standard drinks of alcohol    Types: 2 Cans of beer per week    Comment: daily   Drug use: No   Sexual activity: Yes    Birth control/protection: Surgical  Other Topics Concern   Not on file  Social History Narrative   Not on file   Social Determinants of Health   Financial Resource Strain: Not on file  Food Insecurity: Not on file  Transportation Needs: Not on file  Physical Activity: Not on file  Stress: Not on file  Social Connections: Not on file    Family History  Problem Relation Age of Onset   Diabetes Father    Cancer Maternal Grandfather    Cancer Paternal Grandfather    Diabetes Paternal Grandfather    Allergic rhinitis Neg Hx    Angioedema  Neg Hx    Asthma Neg Hx    Atopy Neg Hx    Immunodeficiency Neg Hx    Eczema Neg Hx    Urticaria Neg Hx     Health Maintenance  Topic Date Due   DTaP/Tdap/Td (2 - Td or Tdap) 01/28/2024   INFLUENZA VACCINE  Completed   COVID-19 Vaccine  Completed   Hepatitis C Screening  Completed   HIV Screening  Completed   HPV VACCINES  Aged Out     ----------------------------------------------------------------------------------------------------------------------------------------------------------------------------------------------------------------- Physical Exam BP (!) 144/81 (BP Location: Right Arm, Patient Position: Sitting, Cuff Size:  Large)   Pulse 90   Ht 6' 3"$  (1.905 m)   Wt (!) 407 lb (184.6 kg)   SpO2 97%   BMI 50.87 kg/m   Physical Exam Constitutional:      General: He is not in acute distress. HENT:     Head: Normocephalic and atraumatic.     Right Ear: Tympanic membrane and external ear normal.     Left Ear: Tympanic membrane and external ear normal.  Eyes:     General: No scleral icterus. Neck:     Thyroid: No thyromegaly.  Cardiovascular:     Rate and Rhythm: Normal rate and regular rhythm.     Heart sounds: Normal heart sounds.  Pulmonary:     Effort: Pulmonary effort is normal.     Breath sounds: Normal breath sounds.  Abdominal:     General: Bowel sounds are normal. There is no distension.     Palpations: Abdomen is soft.     Tenderness: There is no abdominal tenderness. There is no guarding.  Musculoskeletal:     Cervical back: Normal range of motion.  Lymphadenopathy:     Cervical: No cervical adenopathy.  Skin:    General: Skin is warm and dry.     Findings: No rash.  Neurological:     Mental Status: He is alert and oriented to person, place, and time.     Cranial Nerves: No cranial nerve deficit.     Motor: No abnormal muscle tone.  Psychiatric:        Mood and Affect: Mood normal.        Behavior: Behavior normal.     ------------------------------------------------------------------------------------------------------------------------------------------------------------------------------------------------------------------- Assessment and Plan  Obesity He has increased his physical activity.  Having trouble maintaining diet.  Adding zepbound, titrate based on tolerance and response.   Well adult exam Well adult Orders Placed This Encounter  Procedures   COMPLETE METABOLIC PANEL WITH GFR   CBC with Differential   Lipid Panel w/reflex Direct LDL  Screenings: per lab orders Immunizations: COVID and Flu vaccine Anticipatory/Risk factor reduction:  Recommendations per  AVS.    Meds ordered this encounter  Medications   tirzepatide (ZEPBOUND) 2.5 MG/0.5ML Pen    Sig: Inject 2.5 mg into the skin once a week. Increase to 79m after 1 month.    Dispense:  2 mL    Refill:  0   tirzepatide (ZEPBOUND) 5 MG/0.5ML Pen    Sig: Inject 5 mg into the skin once a week. Increase to 7.523mafter 1 month    Dispense:  2 mL    Refill:  0   tirzepatide (ZEPBOUND) 7.5 MG/0.5ML Pen    Sig: Inject 7.5 mg into the skin once a week.    Dispense:  2 mL    Refill:  1    Return in about 3 months (around 02/08/2023) for F/u weight/BP.    This visit occurred  during the SARS-CoV-2 public health emergency.  Safety protocols were in place, including screening questions prior to the visit, additional usage of staff PPE, and extensive cleaning of exam room while observing appropriate contact time as indicated for disinfecting solutions.

## 2022-11-10 NOTE — Assessment & Plan Note (Signed)
Well adult Orders Placed This Encounter  Procedures   COMPLETE METABOLIC PANEL WITH GFR   CBC with Differential   Lipid Panel w/reflex Direct LDL  Screenings: per lab orders Immunizations: COVID and Flu vaccine Anticipatory/Risk factor reduction:  Recommendations per AVS.

## 2022-11-11 ENCOUNTER — Other Ambulatory Visit (HOSPITAL_COMMUNITY): Payer: Self-pay

## 2022-11-11 DIAGNOSIS — Z0000 Encounter for general adult medical examination without abnormal findings: Secondary | ICD-10-CM | POA: Diagnosis not present

## 2022-11-11 DIAGNOSIS — Z13220 Encounter for screening for lipoid disorders: Secondary | ICD-10-CM | POA: Diagnosis not present

## 2022-11-11 LAB — CBC WITH DIFFERENTIAL/PLATELET
Basophils Absolute: 95 cells/uL (ref 0–200)
Eosinophils Absolute: 152 cells/uL (ref 15–500)
Eosinophils Relative: 1.6 %
Lymphs Abs: 3230 cells/uL (ref 850–3900)
MCHC: 34 g/dL (ref 32.0–36.0)
MCV: 83 fL (ref 80.0–100.0)
Monocytes Relative: 7.5 %
Neutro Abs: 5311 cells/uL (ref 1500–7800)
Platelets: 320 10*3/uL (ref 140–400)
RBC: 5.66 10*6/uL (ref 4.20–5.80)
Total Lymphocyte: 34 %
WBC: 9.5 10*3/uL (ref 3.8–10.8)

## 2022-11-12 LAB — COMPLETE METABOLIC PANEL WITH GFR
AG Ratio: 1.8 (calc) (ref 1.0–2.5)
ALT: 23 U/L (ref 9–46)
AST: 15 U/L (ref 10–40)
Albumin: 4.4 g/dL (ref 3.6–5.1)
Alkaline phosphatase (APISO): 51 U/L (ref 36–130)
BUN: 13 mg/dL (ref 7–25)
CO2: 30 mmol/L (ref 20–32)
Calcium: 9.5 mg/dL (ref 8.6–10.3)
Chloride: 100 mmol/L (ref 98–110)
Creat: 0.7 mg/dL (ref 0.60–1.26)
Globulin: 2.5 g/dL (calc) (ref 1.9–3.7)
Glucose, Bld: 85 mg/dL (ref 65–99)
Potassium: 4.8 mmol/L (ref 3.5–5.3)
Sodium: 138 mmol/L (ref 135–146)
Total Bilirubin: 0.7 mg/dL (ref 0.2–1.2)
Total Protein: 6.9 g/dL (ref 6.1–8.1)
eGFR: 126 mL/min/{1.73_m2} (ref >=60)

## 2022-11-12 LAB — LIPID PANEL W/REFLEX DIRECT LDL
Cholesterol: 188 mg/dL (ref <200)
HDL: 41 mg/dL (ref >=40)
LDL Cholesterol (Calc): 129 mg/dL (calc) — ABNORMAL HIGH
Non-HDL Cholesterol (Calc): 147 mg/dL (calc) — ABNORMAL HIGH (ref <130)
Total CHOL/HDL Ratio: 4.6 (calc) (ref <5.0)
Triglycerides: 79 mg/dL (ref <150)

## 2022-11-12 LAB — CBC WITH DIFFERENTIAL/PLATELET
Absolute Monocytes: 713 cells/uL (ref 200–950)
Basophils Relative: 1 %
HCT: 47 % (ref 38.5–50.0)
Hemoglobin: 16 g/dL (ref 13.2–17.1)
MCH: 28.3 pg (ref 27.0–33.0)
MPV: 11.9 fL (ref 7.5–12.5)
Neutrophils Relative %: 55.9 %
RDW: 11.8 % (ref 11.0–15.0)

## 2022-11-17 ENCOUNTER — Other Ambulatory Visit (HOSPITAL_COMMUNITY): Payer: Self-pay

## 2022-11-17 ENCOUNTER — Encounter: Payer: Self-pay | Admitting: Family Medicine

## 2022-11-18 ENCOUNTER — Telehealth: Payer: Self-pay

## 2022-11-18 NOTE — Telephone Encounter (Addendum)
Initiated Prior authorization ZOX:[id number] 2.5MG /0.5ML pen-injectors Via: Covermymeds Case/Key:[id number] Status: approved as of 11/18/22 Reason:Authorization Expiration Date: 05/16/2023 Notified Pt via: Mychart

## 2022-11-19 ENCOUNTER — Other Ambulatory Visit: Payer: Self-pay

## 2022-11-19 ENCOUNTER — Other Ambulatory Visit (HOSPITAL_COMMUNITY): Payer: Self-pay

## 2022-11-20 ENCOUNTER — Other Ambulatory Visit (HOSPITAL_COMMUNITY): Payer: Self-pay

## 2022-11-21 ENCOUNTER — Other Ambulatory Visit (HOSPITAL_COMMUNITY): Payer: Self-pay

## 2022-12-09 ENCOUNTER — Other Ambulatory Visit (HOSPITAL_COMMUNITY): Payer: Self-pay

## 2022-12-15 ENCOUNTER — Other Ambulatory Visit (HOSPITAL_COMMUNITY): Payer: Self-pay

## 2023-02-09 ENCOUNTER — Encounter: Payer: Self-pay | Admitting: Family Medicine

## 2023-02-09 ENCOUNTER — Other Ambulatory Visit (HOSPITAL_COMMUNITY): Payer: Self-pay

## 2023-02-09 ENCOUNTER — Other Ambulatory Visit: Payer: Self-pay | Admitting: Family Medicine

## 2023-02-09 ENCOUNTER — Ambulatory Visit (INDEPENDENT_AMBULATORY_CARE_PROVIDER_SITE_OTHER): Payer: 59 | Admitting: Family Medicine

## 2023-02-09 VITALS — BP 114/79 | HR 73 | Ht 75.0 in | Wt 362.0 lb

## 2023-02-09 DIAGNOSIS — Z6841 Body Mass Index (BMI) 40.0 and over, adult: Secondary | ICD-10-CM | POA: Diagnosis not present

## 2023-02-09 MED ORDER — ZEPBOUND 12.5 MG/0.5ML ~~LOC~~ SOAJ
12.5000 mg | SUBCUTANEOUS | 1 refills | Status: DC
  Filled 2023-02-09 – 2023-03-30 (×3): qty 2, 28d supply, fill #0
  Filled 2023-04-23 – 2023-04-28 (×2): qty 2, 28d supply, fill #1

## 2023-02-09 MED ORDER — ZEPBOUND 10 MG/0.5ML ~~LOC~~ SOAJ: 10.0000 mg | SUBCUTANEOUS | 0 refills | Status: DC

## 2023-02-09 MED ORDER — ZEPBOUND 10 MG/0.5ML ~~LOC~~ SOAJ
10.0000 mg | SUBCUTANEOUS | 0 refills | Status: DC
  Filled 2023-02-09 – 2023-03-04 (×3): qty 2, 28d supply, fill #0

## 2023-02-09 MED ORDER — ZEPBOUND 12.5 MG/0.5ML ~~LOC~~ SOAJ: 12.5000 mg | SUBCUTANEOUS | 1 refills | Status: DC

## 2023-02-09 NOTE — Progress Notes (Signed)
Dylan Rose - 32 y.o. male MRN [id number]  Date of birth: 02-01-1991  Subjective Chief Complaint  Patient presents with   Weight Loss   Hypertension    HPI Dylan Rose is a 32 y.o. here today for follow up visit.   Started on Zepbound at last visit.  Currently at 7.5mg  weekly and tolerating well.  His weight is down 45 lbs  since February.  He has become more active, walking pretty frequently and is working on dietary changes. He is moving to a different insurance and isn't sure if this will continue to be covered.   ROS:  A comprehensive ROS was completed and negative except as noted per HPI  Allergies  Allergen Reactions   Shellfish-Derived Products Swelling    Past Medical History:  Diagnosis Date   Medical history non-contributory     Past Surgical History:  Procedure Laterality Date   ELBOW FRACTURE SURGERY Left 2005   FRACTURE SURGERY     ORIF HUMERUS FRACTURE Left 06/30/2017   Procedure: OPEN REDUCTION INTERNAL FIXATION (ORIF) PROXIMAL HUMERUS FRACTURE;  Surgeon: Dylan Kida, MD;  Location: [facility];  Service: Orthopedics;  Laterality: Left;  180 mins   SHOULDER HEMI-ARTHROPLASTY Left 01/28/2018   Procedure: LEFT SHOULDER HARDWARE REMOVAL AND HEMIARTHROPLASTY;  Surgeon: Dylan Broom, MD;  Location: [facility];  Service: Orthopedics;  Laterality: Left;   WISDOM TOOTH EXTRACTION      Social History   Socioeconomic History   Marital status: Married    Spouse name: Not on file   Number of children: Not on file   Years of education: Not on file   Highest education level: Bachelor's degree (e.g., BA, AB, BS)  Occupational History   Not on file  Tobacco Use   Smoking status: Never    Passive exposure: Never   Smokeless tobacco: Never  Vaping Use   Vaping Use: Never used  Substance and Sexual Activity   Alcohol use: Yes    Alcohol/week: 2.0 standard drinks of alcohol    Types: 2 Cans of beer per week    Comment: daily   Drug use: No    Sexual activity: Yes    Birth control/protection: Surgical  Other Topics Concern   Not on file  Social History Narrative   Not on file   Social Determinants of Health   Financial Resource Strain: Low Risk  (02/05/2023)   Overall Financial Resource Strain (CARDIA)    Difficulty of Paying Living Expenses: Not very hard  Food Insecurity: No Food Insecurity (02/05/2023)   Hunger Vital Sign    Worried About Running Out of Food in the Last Year: Never true    Ran Out of Food in the Last Year: Never true  Transportation Needs: No Transportation Needs (02/05/2023)   PRAPARE - Administrator, Civil Service (Medical): No    Lack of Transportation (Non-Medical): No  Physical Activity: Sufficiently Active (02/05/2023)   Exercise Vital Sign    Days of Exercise per Week: 7 days    Minutes of Exercise per Session: 40 min  Stress: No Stress Concern Present (02/05/2023)   [employer] of Occupational Health - Occupational Stress Questionnaire    Feeling of Stress : Not at all  Social Connections: Socially Integrated (02/05/2023)   Social Connection and Isolation Panel [NHANES]    Frequency of Communication with Friends and Family: More than three times a week    Frequency of Social Gatherings with Friends and Family: More  than three times a week    Attends Religious Services: More than 4 times per year    Active Member of Clubs or Organizations: Yes    Attends Engineer, structural: More than 4 times per year    Marital Status: Married    Family History  Problem Relation Age of Onset   Diabetes Father    Cancer Maternal Grandfather    Cancer Paternal Grandfather    Diabetes Paternal Grandfather    Allergic rhinitis Neg Hx    Angioedema Neg Hx    Asthma Neg Hx    Atopy Neg Hx    Immunodeficiency Neg Hx    Eczema Neg Hx    Urticaria Neg Hx     Health Maintenance  Topic Date Due   INFLUENZA VACCINE  04/23/2023   DTaP/Tdap/Td (2 - Td or Tdap) 01/28/2024    COVID-19 Vaccine  Completed   Hepatitis C Screening  Completed   HIV Screening  Completed   HPV VACCINES  Aged Out     ----------------------------------------------------------------------------------------------------------------------------------------------------------------------------------------------------------------- Physical Exam BP 114/79 (BP Location: Left Arm, Patient Position: Sitting, Cuff Size: Large)   Pulse 73   Ht 6\' 3"  (1.905 m)   Wt (!) 362 lb (164.2 kg)   SpO2 98%   BMI 45.25 kg/m   Physical Exam Constitutional:      Appearance: Normal appearance.  HENT:     Head: Normocephalic and atraumatic.  Eyes:     General: No scleral icterus. Neurological:     General: No focal deficit present.     Mental Status: He is alert.  Psychiatric:        Mood and Affect: Mood normal.        Behavior: Behavior normal.     ------------------------------------------------------------------------------------------------------------------------------------------------------------------------------------------------------------------- Assessment and Plan  Obesity He is doing well with Zepbound.  Will continue titration of Zepbound.  Follow up in 4 months.    Meds ordered this encounter  Medications   tirzepatide (ZEPBOUND) 10 MG/0.5ML Pen    Sig: Inject 10 mg into the skin once a week.    Dispense:  2 mL    Refill:  0   tirzepatide (ZEPBOUND) 12.5 MG/0.5ML Pen    Sig: Inject 12.5 mg into the skin once a week.    Dispense:  2 mL    Refill:  1    Return in about 4 months (around 06/12/2023) for F/u weight mgmt.    This visit occurred during the SARS-CoV-2 public health emergency.  Safety protocols were in place, including screening questions prior to the visit, additional usage of staff PPE, and extensive cleaning of exam room while observing appropriate contact time as indicated for disinfecting solutions.

## 2023-02-09 NOTE — Assessment & Plan Note (Signed)
He is doing well with Zepbound.  Will continue titration of Zepbound.  Follow up in 4 months.

## 2023-02-26 ENCOUNTER — Other Ambulatory Visit (HOSPITAL_COMMUNITY): Payer: Self-pay

## 2023-02-26 ENCOUNTER — Other Ambulatory Visit: Payer: Self-pay

## 2023-03-02 ENCOUNTER — Other Ambulatory Visit: Payer: Self-pay

## 2023-03-02 ENCOUNTER — Other Ambulatory Visit (HOSPITAL_COMMUNITY): Payer: Self-pay

## 2023-03-04 ENCOUNTER — Other Ambulatory Visit (HOSPITAL_COMMUNITY): Payer: Self-pay

## 2023-03-24 ENCOUNTER — Other Ambulatory Visit: Payer: Self-pay

## 2023-03-24 ENCOUNTER — Other Ambulatory Visit (HOSPITAL_COMMUNITY): Payer: Self-pay

## 2023-03-27 ENCOUNTER — Other Ambulatory Visit (HOSPITAL_COMMUNITY): Payer: Self-pay

## 2023-03-30 ENCOUNTER — Other Ambulatory Visit: Payer: Self-pay

## 2023-03-30 ENCOUNTER — Other Ambulatory Visit (HOSPITAL_COMMUNITY): Payer: Self-pay

## 2023-04-02 ENCOUNTER — Other Ambulatory Visit (HOSPITAL_COMMUNITY): Payer: Self-pay

## 2023-04-03 ENCOUNTER — Encounter: Payer: Self-pay | Admitting: Family Medicine

## 2023-04-03 ENCOUNTER — Telehealth: Payer: Self-pay

## 2023-04-03 ENCOUNTER — Other Ambulatory Visit (HOSPITAL_COMMUNITY): Payer: Self-pay

## 2023-04-03 NOTE — Telephone Encounter (Addendum)
Initiated Prior authorization VFI:[id number] 12.5MG /0.5ML pen-injectors Via: Covermymeds Case/Key:[id number] Status: approved as of 04/03/23 Reason:Authorization Expiration Date: 04/01/2024  Notified Pt via: Mychart

## 2023-04-04 ENCOUNTER — Other Ambulatory Visit (HOSPITAL_COMMUNITY): Payer: Self-pay

## 2023-04-06 ENCOUNTER — Other Ambulatory Visit (HOSPITAL_COMMUNITY): Payer: Self-pay

## 2023-04-20 ENCOUNTER — Other Ambulatory Visit (HOSPITAL_COMMUNITY): Payer: Self-pay

## 2023-04-23 ENCOUNTER — Other Ambulatory Visit (HOSPITAL_COMMUNITY): Payer: Self-pay

## 2023-04-28 ENCOUNTER — Other Ambulatory Visit (HOSPITAL_COMMUNITY): Payer: Self-pay

## 2023-06-01 ENCOUNTER — Encounter: Payer: Self-pay | Admitting: Family Medicine

## 2023-06-01 ENCOUNTER — Ambulatory Visit (INDEPENDENT_AMBULATORY_CARE_PROVIDER_SITE_OTHER): Payer: No Typology Code available for payment source | Admitting: Family Medicine

## 2023-06-01 ENCOUNTER — Other Ambulatory Visit (HOSPITAL_COMMUNITY): Payer: Self-pay

## 2023-06-01 VITALS — BP 115/73 | HR 71 | Ht 75.0 in | Wt 330.0 lb

## 2023-06-01 DIAGNOSIS — Z6841 Body Mass Index (BMI) 40.0 and over, adult: Secondary | ICD-10-CM | POA: Diagnosis not present

## 2023-06-01 MED ORDER — ZEPBOUND 12.5 MG/0.5ML ~~LOC~~ SOAJ
12.5000 mg | SUBCUTANEOUS | 3 refills | Status: DC
  Filled 2023-06-01: qty 2, 28d supply, fill #0
  Filled 2023-06-24: qty 2, 28d supply, fill #1
  Filled 2023-07-22: qty 2, 28d supply, fill #2
  Filled 2023-08-19: qty 2, 28d supply, fill #3

## 2023-06-01 NOTE — Progress Notes (Signed)
Dylan Rose - 32 y.o. male MRN [id number]  Date of birth: 04/09/1991  Subjective Chief Complaint  Patient presents with   Weight Management Screening    HPI Dylan Rose is a 32 y.o. male here today for follow up of weight management.    He is currently taking Zepbound 12.5mg /weekly.  He has incorporated dietary changes and is walking frequently and going to the gym to help with weight management.  No particular side effects from this at current strength.  Overall he states that he feels great!  ROS:  A comprehensive ROS was completed and negative except as noted per HPI  No Known Allergies   Past Medical History:  Diagnosis Date   Medical history non-contributory     Past Surgical History:  Procedure Laterality Date   ELBOW FRACTURE SURGERY Left 2005   FRACTURE SURGERY     ORIF HUMERUS FRACTURE Left 06/30/2017   Procedure: OPEN REDUCTION INTERNAL FIXATION (ORIF) PROXIMAL HUMERUS FRACTURE;  Surgeon: Yolonda Kida, MD;  Location: [facility] OR;  Service: Orthopedics;  Laterality: Left;  180 mins   SHOULDER HEMI-ARTHROPLASTY Left 01/28/2018   Procedure: LEFT SHOULDER HARDWARE REMOVAL AND HEMIARTHROPLASTY;  Surgeon: Jones Broom, MD;  Location: [facility] OR;  Service: Orthopedics;  Laterality: Left;   WISDOM TOOTH EXTRACTION      Social History   Socioeconomic History   Marital status: Married    Spouse name: Not on file   Number of children: Not on file   Years of education: Not on file   Highest education level: Bachelor's degree (e.g., BA, AB, BS)  Occupational History   Not on file  Tobacco Use   Smoking status: Never    Passive exposure: Never   Smokeless tobacco: Never  Vaping Use   Vaping status: Never Used  Substance and Sexual Activity   Alcohol use: Yes    Alcohol/week: 2.0 standard drinks of alcohol    Types: 2 Cans of beer per week    Comment: daily   Drug use: No   Sexual activity: Yes    Birth control/protection: Surgical  Other  Topics Concern   Not on file  Social History Narrative   Not on file   Social Determinants of Health   Financial Resource Strain: Low Risk  (02/05/2023)   Overall Financial Resource Strain (CARDIA)    Difficulty of Paying Living Expenses: Not very hard  Food Insecurity: No Food Insecurity (02/05/2023)   Hunger Vital Sign    Worried About Running Out of Food in the Last Year: Never true    Ran Out of Food in the Last Year: Never true  Transportation Needs: No Transportation Needs (02/05/2023)   PRAPARE - Administrator, Civil Service (Medical): No    Lack of Transportation (Non-Medical): No  Physical Activity: Sufficiently Active (02/05/2023)   Exercise Vital Sign    Days of Exercise per Week: 7 days    Minutes of Exercise per Session: 40 min  Stress: No Stress Concern Present (02/05/2023)   [employer] of Occupational Health - Occupational Stress Questionnaire    Feeling of Stress : Not at all  Social Connections: Socially Integrated (02/05/2023)   Social Connection and Isolation Panel [NHANES]    Frequency of Communication with Friends and Family: More than three times a week    Frequency of Social Gatherings with Friends and Family: More than three times a week    Attends Religious Services: More than 4 times per year  Active Member of Clubs or Organizations: Yes    Attends Engineer, structural: More than 4 times per year    Marital Status: Married    Family History  Problem Relation Age of Onset   Diabetes Father    Cancer Maternal Grandfather    Cancer Paternal Grandfather    Diabetes Paternal Grandfather    Allergic rhinitis Neg Hx    Angioedema Neg Hx    Asthma Neg Hx    Atopy Neg Hx    Immunodeficiency Neg Hx    Eczema Neg Hx    Urticaria Neg Hx     Health Maintenance  Topic Date Due   INFLUENZA VACCINE  12/21/2023 (Originally 04/23/2023)   DTaP/Tdap/Td (2 - Td or Tdap) 01/28/2024   COVID-19 Vaccine  Completed   Hepatitis C Screening   Completed   HIV Screening  Completed   HPV VACCINES  Aged Out     ----------------------------------------------------------------------------------------------------------------------------------------------------------------------------------------------------------------- Physical Exam BP 115/73 (BP Location: Left Arm, Patient Position: Sitting, Cuff Size: Large)   Pulse 71   Ht 6\' 3"  (1.905 m)   Wt (!) 330 lb (149.7 kg)   SpO2 99%   BMI 41.25 kg/m   Physical Exam Constitutional:      Appearance: Normal appearance.  Cardiovascular:     Rate and Rhythm: Normal rate and regular rhythm.  Neurological:     Mental Status: He is alert.  Psychiatric:        Mood and Affect: Mood normal.        Behavior: Behavior normal.     ------------------------------------------------------------------------------------------------------------------------------------------------------------------------------------------------------------------- Assessment and Plan  Obesity He continues to do quite well with Zepbound.  Will continue at 12.5mg  strength.  Encouraged continued dietary changes.     No orders of the defined types were placed in this encounter.   Return in about 4 months (around 10/01/2023) for Weight mgmt.    This visit occurred during the SARS-CoV-2 public health emergency.  Safety protocols were in place, including screening questions prior to the visit, additional usage of staff PPE, and extensive cleaning of exam room while observing appropriate contact time as indicated for disinfecting solutions.

## 2023-06-01 NOTE — Assessment & Plan Note (Signed)
He continues to do quite well with Zepbound.  Will continue at 12.5mg  strength.  Encouraged continued dietary changes.

## 2023-06-02 ENCOUNTER — Other Ambulatory Visit (HOSPITAL_COMMUNITY): Payer: Self-pay

## 2023-06-15 ENCOUNTER — Ambulatory Visit: Payer: 59 | Admitting: Family Medicine

## 2023-06-24 ENCOUNTER — Other Ambulatory Visit (HOSPITAL_COMMUNITY): Payer: Self-pay

## 2023-06-26 ENCOUNTER — Ambulatory Visit: Payer: No Typology Code available for payment source | Admitting: Podiatry

## 2023-06-26 DIAGNOSIS — L603 Nail dystrophy: Secondary | ICD-10-CM | POA: Diagnosis not present

## 2023-06-26 DIAGNOSIS — L600 Ingrowing nail: Secondary | ICD-10-CM | POA: Diagnosis not present

## 2023-06-26 NOTE — Progress Notes (Signed)
  Subjective:  Patient ID: Dylan Rose, male    DOB: [birth year],  MRN: [id number]  Chief Complaint  Patient presents with   Nail Problem    Right hallux nail thickness discoloration. Has never been treated for nail fungus.     32 y.o. male presents with concern for right great toe thickness and discoloration.  He does not have a ton of pain but does have difficulty with certain shoes and pressure on the nail due to the thickness  Past Medical History:  Diagnosis Date   Medical history non-contributory     No Known Allergies  ROS: Negative except as per HPI above  Objective:  General: AAO x3, NAD  Dermatological: Significant discoloration and thickening as well as fungal dystrophy of the right hallux nail.  Ingrowth over the entire nailbed due to thickness  Vascular:  Dorsalis Pedis artery and Posterior Tibial artery pedal pulses are 2/4 bilateral.  Capillary fill time < 3 sec to all digits.   Neruologic: Grossly intact via light touch bilateral. Protective threshold intact to all sites bilateral.   Musculoskeletal: No gross boney pedal deformities bilateral. No pain, crepitus, or limitation noted with foot and ankle range of motion bilateral. Muscular strength 5/5 in all groups tested bilateral.  Gait: Unassisted, Nonantalgic.   No images are attached to the encounter.   Assessment:   1. Ingrown nail of great toe of right foot   2. Nail dystrophy      Plan:  Patient was evaluated and treated and all questions answered.  Ingrown Nail, right -Patient elects to proceed with minor surgery to remove ingrown toenail today. Consent reviewed and signed by patient. -Ingrown nail excised. See procedure note. -Educated on post-procedure care including soaking. Written instructions provided and reviewed. -Patient to follow up in 2 weeks for nail check.  Procedure: Excision of Ingrown Toenail Location: Right 1st toe total nail  Anesthesia: Lidocaine 1% plain; 1.5 mL  and Marcaine 0.5% plain; 1.5 mL, digital block. Skin Prep: Betadine. Dressing: Silvadene; telfa; dry, sterile, compression dressing. Technique: Following skin prep, the toe was exsanguinated and a tourniquet was secured at the base of the toe. The affected nail border was freed, split with a nail splitter, and excised. Chemical matrixectomy was then performed with sodium hydroxide and irrigated out with vinegar.  The tourniquet was then removed and sterile dressing applied. Disposition: Patient tolerated procedure well. Patient to return in 2 weeks for follow-up.    Return in about 2 weeks (around 07/10/2023) for nail check R hallux.          Corinna Gab, DPM [facility] / [facility]

## 2023-06-26 NOTE — Patient Instructions (Signed)

## 2023-06-27 ENCOUNTER — Encounter: Payer: Self-pay | Admitting: Podiatry

## 2023-07-02 ENCOUNTER — Other Ambulatory Visit (HOSPITAL_COMMUNITY): Payer: Self-pay

## 2023-07-02 MED ORDER — CEPHALEXIN 500 MG PO CAPS
500.0000 mg | ORAL_CAPSULE | Freq: Three times a day (TID) | ORAL | 0 refills | Status: DC
  Filled 2023-07-02: qty 21, 7d supply, fill #0

## 2023-07-09 ENCOUNTER — Encounter: Payer: Self-pay | Admitting: Podiatry

## 2023-07-09 ENCOUNTER — Ambulatory Visit: Payer: No Typology Code available for payment source | Admitting: Podiatry

## 2023-07-09 DIAGNOSIS — L600 Ingrowing nail: Secondary | ICD-10-CM

## 2023-07-09 NOTE — Progress Notes (Signed)
Subjective: Dylan Rose is a 32 y.o.  male returns to office today for follow up evaluation after having right Hallux total nail ingrown removal with NaOH matrixectomy approximately 2 weeks ago. Patient has been soaking using epsom salts and applying topical antibiotic covered with bandaid daily. Patient denies fevers, chills, nausea, vomiting. Denies any calf pain, chest pain, SOB.   Objective:  Vitals: Reviewed  General: Well developed, nourished, in no acute distress, alert and oriented x3   Dermatology: Skin is warm, dry and supple bilateral. right hallux nail border appears to be clean, dry, with mild granular tissue and surrounding scab. There is no surrounding erythema, edema, drainage/purulence. The remaining nails appear unremarkable at this time. There are no other lesions or other signs of infection present.  Neurovascular status: Intact. No lower extremity swelling; No pain with calf compression bilateral.  Musculoskeletal: Decreased tenderness to palpation of the right hallux nail bed. Muscular strength within normal limits bilateral.   Assesement and Plan: S/p phenol and alcohol matrixectomy to the  right hallux nail total, doing well.   -Continue soaking in epsom salts twice a day followed by antibiotic ointment and a band-aid. Can leave uncovered at night. Continue this until completely healed.  -If the area has not healed in 2 weeks, call the office for follow-up appointment, or sooner if any problems arise.  -Monitor for any signs/symptoms of infection. Call the office immediately if any occur or go directly to the emergency room. Call with any questions/concerns.        Corinna Gab, DPM [facility] / [facility]                   07/09/2023

## 2023-07-22 ENCOUNTER — Other Ambulatory Visit (HOSPITAL_COMMUNITY): Payer: Self-pay

## 2023-08-04 ENCOUNTER — Ambulatory Visit (INDEPENDENT_AMBULATORY_CARE_PROVIDER_SITE_OTHER): Payer: No Typology Code available for payment source

## 2023-08-04 ENCOUNTER — Other Ambulatory Visit (HOSPITAL_COMMUNITY): Payer: Self-pay

## 2023-08-04 ENCOUNTER — Ambulatory Visit: Payer: No Typology Code available for payment source | Admitting: Sports Medicine

## 2023-08-04 VITALS — BP 130/80 | HR 88 | Ht 75.0 in | Wt 324.0 lb

## 2023-08-04 DIAGNOSIS — M545 Low back pain, unspecified: Secondary | ICD-10-CM

## 2023-08-04 MED ORDER — MELOXICAM 15 MG PO TABS
15.0000 mg | ORAL_TABLET | Freq: Every day | ORAL | 0 refills | Status: DC
  Filled 2023-08-04: qty 30, 30d supply, fill #0

## 2023-08-04 NOTE — Progress Notes (Signed)
    Dylan Rose D.[facility] [address] [place] [address] Phone: [phone]   Assessment and Plan:     1. Acute right-sided low back pain without sciatica -Acute, uncomplicated, initial visit - Right-sided low back pain after patient fell while hiking and struck right lower back against a rock.  Most consistent with pain related to contusion without fracture - X-ray obtained in clinic.  My interpretation: No acute fracture or dislocation.  Grade 1 anterolisthesis L5 on S1 - Start meloxicam 15 mg daily x2 weeks.  If still having pain after 2 weeks, complete 3rd-week of meloxicam. May use remaining meloxicam as needed once daily for pain control.  Do not to use additional NSAIDs while taking meloxicam.  May use Tylenol [phone] mg 2 to 3 times a day for breakthrough pain. - Recommend avoiding physical activity that flares pain such as lunges and squats for 1 to 2 weeks.  Recommend elliptical and stationary bike for physical activity which may put less pressure over contusion area   Pertinent previous records reviewed include none  Follow Up: As needed   Subjective:   I, Dylan Rose, am serving as a Neurosurgeon for Doctor Richardean Sale  Chief Complaint: low back pain   HPI:   08/04/23 Patient is a 32 year old male with concerns of low back pain. Patient states that he fell yesterday. Hit his back on a rock. Has pain when getting up and down   Relevant Historical Information: None pertinent  Additional pertinent review of systems negative.   Current Outpatient Medications:    meloxicam (MOBIC) 15 MG tablet, Take 1 tablet (15 mg total) by mouth daily., Disp: 30 tablet, Rfl: 0   tirzepatide (ZEPBOUND) 12.5 MG/0.5ML Pen, Inject 12.5 mg into the skin once a week., Disp: 2 mL, Rfl: 3   cephALEXin (KEFLEX) 500 MG capsule, Take 1 capsule (500 mg total) by mouth 3 (three) times daily., Disp: 21 capsule, Rfl: 0   Objective:     Vitals:    08/04/23 1039  BP: 130/80  Pulse: 88  SpO2: 98%  Weight: (!) 324 lb (147 kg)  Height: 6\' 3"  (1.905 m)      Body mass index is 40.5 kg/m.    Physical Exam:    Gen: Appears well, nad, nontoxic and pleasant Psych: Alert and oriented, appropriate mood and affect Neuro: sensation intact, strength is 5/5 in upper and lower extremities, muscle tone wnl Skin: no susupicious lesions or rashes  Back - Normal skin, Spine with normal alignment and no deformity.   No tenderness to vertebral process palpation.   Paraspinous muscles are not tender and without spasm TTP right lower back and area of contusion with mild ecchymosis Straight leg raise negative Trendelenberg negative Gait normal Right low back pain when changing from sitting to standing position   Electronically signed by:  Dylan Rose D.[facility] 10:54 AM 08/04/23

## 2023-08-04 NOTE — Patient Instructions (Addendum)
-   Start meloxicam 15 mg daily x2 weeks.  If still having pain after 2 weeks, complete 3rd-week of meloxicam. May use remaining meloxicam as needed once daily for pain control.  Do not to use additional NSAIDs while taking meloxicam.  May use Tylenol [phone] mg 2 to 3 times a day for breakthrough pain. Stationary bike, elliptical, water aerobics over the next 1-2 week  As needed follow up

## 2023-08-22 ENCOUNTER — Other Ambulatory Visit (HOSPITAL_COMMUNITY): Payer: Self-pay

## 2023-09-01 ENCOUNTER — Other Ambulatory Visit: Payer: Self-pay | Admitting: Family Medicine

## 2023-09-02 ENCOUNTER — Other Ambulatory Visit (HOSPITAL_COMMUNITY): Payer: Self-pay

## 2023-09-02 MED ORDER — ZEPBOUND 12.5 MG/0.5ML ~~LOC~~ SOAJ
12.5000 mg | SUBCUTANEOUS | 0 refills | Status: DC
  Filled 2023-09-02 – 2023-09-14 (×3): qty 2, 28d supply, fill #0

## 2023-09-09 ENCOUNTER — Other Ambulatory Visit (HOSPITAL_COMMUNITY): Payer: Self-pay

## 2023-09-14 ENCOUNTER — Other Ambulatory Visit (HOSPITAL_COMMUNITY): Payer: Self-pay

## 2023-10-02 ENCOUNTER — Other Ambulatory Visit (HOSPITAL_COMMUNITY): Payer: Self-pay

## 2023-10-02 MED ORDER — ZEPBOUND 15 MG/0.5ML ~~LOC~~ SOAJ
15.0000 mg | SUBCUTANEOUS | 0 refills | Status: DC
  Filled 2023-10-02: qty 2, 28d supply, fill #0

## 2023-10-05 ENCOUNTER — Ambulatory Visit: Payer: No Typology Code available for payment source | Admitting: Family Medicine

## 2023-10-08 ENCOUNTER — Other Ambulatory Visit (HOSPITAL_COMMUNITY): Payer: Self-pay

## 2023-10-27 ENCOUNTER — Ambulatory Visit: Payer: No Typology Code available for payment source | Admitting: Family Medicine

## 2023-10-27 ENCOUNTER — Encounter: Payer: Self-pay | Admitting: Family Medicine

## 2023-10-27 ENCOUNTER — Other Ambulatory Visit (HOSPITAL_COMMUNITY): Payer: Self-pay

## 2023-10-27 VITALS — BP 132/84 | HR 97 | Ht 75.0 in | Wt 311.0 lb

## 2023-10-27 DIAGNOSIS — L02412 Cutaneous abscess of left axilla: Secondary | ICD-10-CM

## 2023-10-27 DIAGNOSIS — L0291 Cutaneous abscess, unspecified: Secondary | ICD-10-CM | POA: Insufficient documentation

## 2023-10-27 MED ORDER — DOXYCYCLINE HYCLATE 100 MG PO TABS
100.0000 mg | ORAL_TABLET | Freq: Two times a day (BID) | ORAL | 0 refills | Status: DC
  Filled 2023-10-27: qty 20, 10d supply, fill #0

## 2023-10-27 NOTE — Progress Notes (Signed)
 Dylan Rose - 33 y.o. male MRN [id number]  Date of birth: [birth year]  Subjective Chief Complaint  Patient presents with   boils    Pt reports that the bumps appeared 3-4 days ago under his L armpit. He denies any changes to lotions,soaps,detergent,deodorant. He doesn't shave the area There are several raised areas he has done warm compresses last night and this morning     HPI Dylan Rose is a 33 y.o. male here today with complaint of swelling in the underarm.  Noticed bumps in the axilla about 3-4 days ago.  No changes to lotions, soaps, deodorant.  He has been trying warm compresses.  He denies fever/chills.  No drainage from the area.    ROS:  A comprehensive ROS was completed and negative except as noted per HPI  No Known Allergies  Past Medical History:  Diagnosis Date   Medical history non-contributory     Past Surgical History:  Procedure Laterality Date   ELBOW FRACTURE SURGERY Left 2005   FRACTURE SURGERY     ORIF HUMERUS FRACTURE Left 06/30/2017   Procedure: OPEN REDUCTION INTERNAL FIXATION (ORIF) PROXIMAL HUMERUS FRACTURE;  Surgeon: Sharl Selinda Dover, MD;  Location: [facility] OR;  Service: Orthopedics;  Laterality: Left;  180 mins   SHOULDER HEMI-ARTHROPLASTY Left 01/28/2018   Procedure: LEFT SHOULDER HARDWARE REMOVAL AND HEMIARTHROPLASTY;  Surgeon: Dozier Soulier, MD;  Location: [facility] OR;  Service: Orthopedics;  Laterality: Left;   WISDOM TOOTH EXTRACTION      Social History   Socioeconomic History   Marital status: Married    Spouse name: Not on file   Number of children: Not on file   Years of education: Not on file   Highest education level: Bachelor's degree (e.g., BA, AB, BS)  Occupational History   Not on file  Tobacco Use   Smoking status: Never    Passive exposure: Never   Smokeless tobacco: Never  Vaping Use   Vaping status: Never Used  Substance and Sexual Activity   Alcohol use: Yes    Alcohol/week: 2.0 standard drinks of  alcohol    Types: 2 Cans of beer per week    Comment: daily   Drug use: No   Sexual activity: Yes    Birth control/protection: Surgical  Other Topics Concern   Not on file  Social History Narrative   Not on file   Social Drivers of Health   Financial Resource Strain: Low Risk  (02/05/2023)   Overall Financial Resource Strain (CARDIA)    Difficulty of Paying Living Expenses: Not very hard  Food Insecurity: No Food Insecurity (02/05/2023)   Hunger Vital Sign    Worried About Running Out of Food in the Last Year: Never true    Ran Out of Food in the Last Year: Never true  Transportation Needs: No Transportation Needs (02/05/2023)   PRAPARE - Administrator, Civil Service (Medical): No    Lack of Transportation (Non-Medical): No  Physical Activity: Sufficiently Active (02/05/2023)   Exercise Vital Sign    Days of Exercise per Week: 7 days    Minutes of Exercise per Session: 40 min  Stress: No Stress Concern Present (02/05/2023)   [employer] of Occupational Health - Occupational Stress Questionnaire    Feeling of Stress : Not at all  Social Connections: Socially Integrated (02/05/2023)   Social Connection and Isolation Panel [NHANES]    Frequency of Communication with Friends and Family: More than three times a week  Frequency of Social Gatherings with Friends and Family: More than three times a week    Attends Religious Services: More than 4 times per year    Active Member of [employer] or Organizations: Yes    Attends Engineer, Structural: More than 4 times per year    Marital Status: Married    Family History  Problem Relation Age of Onset   Diabetes Father    Cancer Maternal Grandfather    Cancer Paternal Grandfather    Diabetes Paternal Grandfather    Allergic rhinitis Neg Hx    Angioedema Neg Hx    Asthma Neg Hx    Atopy Neg Hx    Immunodeficiency Neg Hx    Eczema Neg Hx    Urticaria Neg Hx     Health Maintenance  Topic Date Due   COVID-19  Vaccine (2 - 2024-25 season) 05/24/2023   DTaP/Tdap/Td (2 - Td or Tdap) 01/28/2024   INFLUENZA VACCINE  Completed   Hepatitis C Screening  Completed   HIV Screening  Completed   HPV VACCINES  Aged Out     ----------------------------------------------------------------------------------------------------------------------------------------------------------------------------------------------------------------- Physical Exam BP 132/84   Pulse 97   Ht 6' 3 (1.905 m)   Wt (!) 311 lb (141.1 kg)   SpO2 96%   BMI 38.87 kg/m   Physical Exam Constitutional:      Appearance: Normal appearance.  HENT:     Head: Normocephalic and atraumatic.  Eyes:     General: No scleral icterus. Musculoskeletal:     Cervical back: Neck supple.  Skin:    Comments: Indurated area with surrounding erythema in the L axilla.  No significant fluctuance noted.   Neurological:     Mental Status: He is alert.  Psychiatric:        Mood and Affect: Mood normal.        Behavior: Behavior normal.     ------------------------------------------------------------------------------------------------------------------------------------------------------------------------------------------------------------------- Assessment and Plan  Abscess of skin No significant fluctuance noted.  Adding course of doxycycline  and recommend continuation of warm compresses.  Red flags reviewed.  Contact clinic if not improving.    Meds ordered this encounter  Medications   doxycycline  (VIBRA -TABS) 100 MG tablet    Sig: Take 1 tablet (100 mg total) by mouth 2 (two) times daily for 10 days.    Dispense:  20 tablet    Refill:  0    No follow-ups on file.    This visit occurred during the SARS-CoV-2 public health emergency.  Safety protocols were in place, including screening questions prior to the visit, additional usage of staff PPE, and extensive cleaning of exam room while observing appropriate contact time as  indicated for disinfecting solutions.

## 2023-10-27 NOTE — Assessment & Plan Note (Signed)
No significant fluctuance noted.  Adding course of doxycycline and recommend continuation of warm compresses.  Red flags reviewed.  Contact clinic if not improving.

## 2023-10-27 NOTE — Patient Instructions (Signed)
Start doxycycline.  Continue warm compress several times per day.  Let me know if worsening.

## 2023-10-30 ENCOUNTER — Other Ambulatory Visit (HOSPITAL_COMMUNITY): Payer: Self-pay

## 2023-10-30 ENCOUNTER — Encounter: Payer: Self-pay | Admitting: Family Medicine

## 2023-10-30 MED ORDER — SULFAMETHOXAZOLE-TRIMETHOPRIM 800-160 MG PO TABS
1.0000 | ORAL_TABLET | Freq: Two times a day (BID) | ORAL | 0 refills | Status: AC
  Filled 2023-10-30: qty 20, 10d supply, fill #0

## 2023-11-06 ENCOUNTER — Other Ambulatory Visit (HOSPITAL_COMMUNITY): Payer: Self-pay

## 2023-11-06 ENCOUNTER — Other Ambulatory Visit: Payer: Self-pay

## 2023-11-06 MED ORDER — ZEPBOUND 15 MG/0.5ML ~~LOC~~ SOAJ
15.0000 mg | SUBCUTANEOUS | 0 refills | Status: DC
  Filled 2023-11-06: qty 2, 28d supply, fill #0

## 2023-11-13 ENCOUNTER — Other Ambulatory Visit (HOSPITAL_COMMUNITY): Payer: Self-pay

## 2023-11-13 ENCOUNTER — Encounter: Payer: Self-pay | Admitting: Family Medicine

## 2023-11-13 ENCOUNTER — Ambulatory Visit (INDEPENDENT_AMBULATORY_CARE_PROVIDER_SITE_OTHER): Payer: No Typology Code available for payment source | Admitting: Family Medicine

## 2023-11-13 VITALS — BP 118/81 | HR 69 | Ht 75.0 in | Wt 309.0 lb

## 2023-11-13 DIAGNOSIS — Z0000 Encounter for general adult medical examination without abnormal findings: Secondary | ICD-10-CM

## 2023-11-13 DIAGNOSIS — Z23 Encounter for immunization: Secondary | ICD-10-CM

## 2023-11-13 DIAGNOSIS — Z13220 Encounter for screening for lipoid disorders: Secondary | ICD-10-CM | POA: Diagnosis not present

## 2023-11-13 MED ORDER — SCOPOLAMINE 1 MG/3DAYS TD PT72
1.0000 | MEDICATED_PATCH | TRANSDERMAL | 0 refills | Status: AC
  Filled 2023-11-13: qty 4, 12d supply, fill #0

## 2023-11-13 NOTE — Addendum Note (Signed)
 Addended by: Ardyth Man on: 11/13/2023 08:28 AM   Modules accepted: Orders

## 2023-11-13 NOTE — Progress Notes (Signed)
 Mose Colaizzi IV - 33 y.o. male MRN [id number]  Date of birth: 1990-11-13  Subjective Chief Complaint  Patient presents with   Annual Exam    HPI Jerelle Virden IV is a 33 y.o. male here today for annual exam.   He reports that he is doing well. Abscess in arm is resolved. Marland Kitchen   He is moderately active and working on dietary changes.   He is a non-smoker.  Occasional EtOH use.   Review of Systems  Constitutional:  Negative for chills, fever, malaise/fatigue and weight loss.  HENT:  Negative for congestion, ear pain and sore throat.   Eyes:  Negative for blurred vision, double vision and pain.  Respiratory:  Negative for cough and shortness of breath.   Cardiovascular:  Negative for chest pain and palpitations.  Gastrointestinal:  Negative for abdominal pain, blood in stool, constipation, heartburn and nausea.  Genitourinary:  Negative for dysuria and urgency.  Musculoskeletal:  Negative for joint pain and myalgias.  Neurological:  Negative for dizziness and headaches.  Endo/Heme/Allergies:  Does not bruise/bleed easily.  Psychiatric/Behavioral:  Negative for depression. The patient is not nervous/anxious and does not have insomnia.     No Known Allergies  Past Medical History:  Diagnosis Date   Medical history non-contributory     Past Surgical History:  Procedure Laterality Date   ELBOW FRACTURE SURGERY Left 2005   FRACTURE SURGERY     ORIF HUMERUS FRACTURE Left 06/30/2017   Procedure: OPEN REDUCTION INTERNAL FIXATION (ORIF) PROXIMAL HUMERUS FRACTURE;  Surgeon: Yolonda Kida, MD;  Location: [facility] OR;  Service: Orthopedics;  Laterality: Left;  180 mins   SHOULDER HEMI-ARTHROPLASTY Left 01/28/2018   Procedure: LEFT SHOULDER HARDWARE REMOVAL AND HEMIARTHROPLASTY;  Surgeon: Jones Broom, MD;  Location: [facility] OR;  Service: Orthopedics;  Laterality: Left;   WISDOM TOOTH EXTRACTION      Social History   Socioeconomic History   Marital status: Married     Spouse name: Not on file   Number of children: Not on file   Years of education: Not on file   Highest education level: Bachelor's degree (e.g., BA, AB, BS)  Occupational History   Not on file  Tobacco Use   Smoking status: Never    Passive exposure: Never   Smokeless tobacco: Never  Vaping Use   Vaping status: Never Used  Substance and Sexual Activity   Alcohol use: Yes    Alcohol/week: 2.0 standard drinks of alcohol    Types: 2 Cans of beer per week    Comment: daily   Drug use: No   Sexual activity: Yes    Birth control/protection: Surgical  Other Topics Concern   Not on file  Social History Narrative   Not on file   Social Drivers of Health   Financial Resource Strain: Low Risk  (11/12/2023)   Overall Financial Resource Strain (CARDIA)    Difficulty of Paying Living Expenses: Not hard at all  Food Insecurity: No Food Insecurity (11/12/2023)   Hunger Vital Sign    Worried About Running Out of Food in the Last Year: Never true    Ran Out of Food in the Last Year: Never true  Transportation Needs: No Transportation Needs (11/12/2023)   PRAPARE - Administrator, Civil Service (Medical): No    Lack of Transportation (Non-Medical): No  Physical Activity: Sufficiently Active (11/12/2023)   Exercise Vital Sign    Days of Exercise per Week: 5 days    Minutes  of Exercise per Session: 60 min  Stress: No Stress Concern Present (11/12/2023)   [employer] of Occupational Health - Occupational Stress Questionnaire    Feeling of Stress : Not at all  Social Connections: Socially Integrated (11/12/2023)   Social Connection and Isolation Panel [NHANES]    Frequency of Communication with Friends and Family: More than three times a week    Frequency of Social Gatherings with Friends and Family: More than three times a week    Attends Religious Services: More than 4 times per year    Active Member of [employer] or Organizations: No    Attends Engineer, structural: More  than 4 times per year    Marital Status: Married    Family History  Problem Relation Age of Onset   Diabetes Father    Cancer Maternal Grandfather    Cancer Paternal Grandfather    Diabetes Paternal Grandfather    Allergic rhinitis Neg Hx    Angioedema Neg Hx    Asthma Neg Hx    Atopy Neg Hx    Immunodeficiency Neg Hx    Eczema Neg Hx    Urticaria Neg Hx     Health Maintenance  Topic Date Due   COVID-19 Vaccine (2 - 2024-25 season) 05/24/2023   DTaP/Tdap/Td (2 - Td or Tdap) 01/28/2024   INFLUENZA VACCINE  Completed   Hepatitis C Screening  Completed   HIV Screening  Completed   HPV VACCINES  Aged Out     ----------------------------------------------------------------------------------------------------------------------------------------------------------------------------------------------------------------- Physical Exam BP 118/81 (BP Location: Left Arm, Patient Position: Sitting, Cuff Size: Large)   Pulse 69   Ht 6\' 3"  (1.905 m)   Wt (!) 309 lb (140.2 kg)   SpO2 100%   BMI 38.62 kg/m   Physical Exam Constitutional:      General: He is not in acute distress. HENT:     Head: Normocephalic and atraumatic.     Right Ear: Tympanic membrane and external ear normal.     Left Ear: Tympanic membrane and external ear normal.  Eyes:     General: No scleral icterus. Neck:     Thyroid: No thyromegaly.  Cardiovascular:     Rate and Rhythm: Normal rate and regular rhythm.     Heart sounds: Normal heart sounds.  Pulmonary:     Effort: Pulmonary effort is normal.     Breath sounds: Normal breath sounds.  Abdominal:     General: Bowel sounds are normal. There is no distension.     Palpations: Abdomen is soft.     Tenderness: There is no abdominal tenderness. There is no guarding.  Musculoskeletal:     Cervical back: Normal range of motion.  Lymphadenopathy:     Cervical: No cervical adenopathy.  Skin:    General: Skin is warm and dry.     Findings: No rash.   Neurological:     Mental Status: He is alert and oriented to person, place, and time.     Cranial Nerves: No cranial nerve deficit.     Motor: No abnormal muscle tone.  Psychiatric:        Mood and Affect: Mood normal.        Behavior: Behavior normal.     ------------------------------------------------------------------------------------------------------------------------------------------------------------------------------------------------------------------- Assessment and Plan  Well adult exam Well adult Orders Placed This Encounter  Procedures   CBC with Differential/Platelet   CMP14+EGFR   Lipid Panel With LDL/HDL Ratio  Screenings: per lab orders Immunizations: Tdap Anticipatory/Risk factor reduction:  Recommendations per AVS.  Meds ordered this encounter  Medications   scopolamine (TRANSDERM-SCOP) 1 MG/3DAYS    Sig: Place 1 patch (1.5 mg total) onto the skin every 3 (three) days.    Dispense:  4 patch    Refill:  0    No follow-ups on file.    This visit occurred during the SARS-CoV-2 public health emergency.  Safety protocols were in place, including screening questions prior to the visit, additional usage of staff PPE, and extensive cleaning of exam room while observing appropriate contact time as indicated for disinfecting solutions.

## 2023-11-13 NOTE — Assessment & Plan Note (Addendum)
 Well adult Orders Placed This Encounter  Procedures   CBC with Differential/Platelet   CMP14+EGFR   Lipid Panel With LDL/HDL Ratio  Screenings: per lab orders Immunizations: Tdap Anticipatory/Risk factor reduction:  Recommendations per AVS.

## 2023-11-13 NOTE — Patient Instructions (Signed)
 Preventive Care 76-34 Years Old, Male Preventive care refers to lifestyle choices and visits with your health care provider that can promote health and wellness. Preventive care visits are also called wellness exams. What can I expect for my preventive care visit? Counseling During your preventive care visit, your health care provider may ask about your: Medical history, including: Past medical problems. Family medical history. Current health, including: Emotional well-being. Home life and relationship well-being. Sexual activity. Lifestyle, including: Alcohol, nicotine or tobacco, and drug use. Access to firearms. Diet, exercise, and sleep habits. Safety issues such as seatbelt and bike helmet use. Sunscreen use. Work and work Astronomer. Physical exam Your health care provider may check your: Height and weight. These may be used to calculate your BMI (body mass index). BMI is a measurement that tells if you are at a healthy weight. Waist circumference. This measures the distance around your waistline. This measurement also tells if you are at a healthy weight and may help predict your risk of certain diseases, such as type 2 diabetes and high blood pressure. Heart rate and blood pressure. Body temperature. Skin for abnormal spots. What immunizations do I need?  Vaccines are usually given at various ages, according to a schedule. Your health care provider will recommend vaccines for you based on your age, medical history, and lifestyle or other factors, such as travel or where you work. What tests do I need? Screening Your health care provider may recommend screening tests for certain conditions. This may include: Lipid and cholesterol levels. Diabetes screening. This is done by checking your blood sugar (glucose) after you have not eaten for a while (fasting). Hepatitis B test. Hepatitis C test. HIV (human immunodeficiency virus) test. STI (sexually transmitted infection)  testing, if you are at risk. Talk with your health care provider about your test results, treatment options, and if necessary, the need for more tests. Follow these instructions at home: Eating and drinking  Eat a healthy diet that includes fresh fruits and vegetables, whole grains, lean protein, and low-fat dairy products. Drink enough fluid to keep your urine pale yellow. Take vitamin and mineral supplements as recommended by your health care provider. Do not drink alcohol if your health care provider tells you not to drink. If you drink alcohol: Limit how much you have to 0-2 drinks a day. Know how much alcohol is in your drink. In the [place] one drink equals one 12 oz bottle of beer (355 mL), one 5 oz glass of wine (148 mL), or one 1 oz glass of hard liquor (44 mL). Lifestyle Brush your teeth every morning and night with fluoride toothpaste. Floss one time each day. Exercise for at least 30 minutes 5 or more days each week. Do not use any products that contain nicotine or tobacco. These products include cigarettes, chewing tobacco, and vaping devices, such as e-cigarettes. If you need help quitting, ask your health care provider. Do not use drugs. If you are sexually active, practice safe sex. Use a condom or other form of protection to prevent STIs. Find healthy ways to manage stress, such as: Meditation, yoga, or listening to music. Journaling. Talking to a trusted person. Spending time with friends and family. Minimize exposure to UV radiation to reduce your risk of skin cancer. Safety Always wear your seat belt while driving or riding in a vehicle. Do not drive: If you have been drinking alcohol. Do not ride with someone who has been drinking. If you have been using any mind-altering substances  or drugs. While texting. When you are tired or distracted. Wear a helmet and other protective equipment during sports activities. If you have firearms in your house, make sure you  follow all gun safety procedures. Seek help if you have been physically or sexually abused. What's next? Go to your health care provider once a year for an annual wellness visit. Ask your health care provider how often you should have your eyes and teeth checked. Stay up to date on all vaccines. This information is not intended to replace advice given to you by your health care provider. Make sure you discuss any questions you have with your health care provider. Document Revised: 03/06/2021 Document Reviewed: 03/06/2021 Elsevier Patient Education  2024 [employer].

## 2023-11-14 LAB — CBC WITH DIFFERENTIAL/PLATELET
Basophils Absolute: 0.1 10*3/uL (ref 0.0–0.2)
Basos: 1 %
EOS (ABSOLUTE): 0.1 10*3/uL (ref 0.0–0.4)
Eos: 2 %
Hematocrit: 47.8 % (ref 37.5–51.0)
Hemoglobin: 15.7 g/dL (ref 13.0–17.7)
Immature Grans (Abs): 0 10*3/uL (ref 0.0–0.1)
Immature Granulocytes: 0 %
Lymphocytes Absolute: 2.9 10*3/uL (ref 0.7–3.1)
Lymphs: 38 %
MCH: 28.8 pg (ref 26.6–33.0)
MCHC: 32.8 g/dL (ref 31.5–35.7)
MCV: 88 fL (ref 79–97)
Monocytes Absolute: 0.6 10*3/uL (ref 0.1–0.9)
Monocytes: 8 %
Neutrophils Absolute: 4 10*3/uL (ref 1.4–7.0)
Neutrophils: 51 %
Platelets: 303 10*3/uL (ref 150–450)
RBC: 5.45 x10E6/uL (ref 4.14–5.80)
RDW: 12.1 % (ref 11.6–15.4)
WBC: 7.7 10*3/uL (ref 3.4–10.8)

## 2023-11-14 LAB — LIPID PANEL WITH LDL/HDL RATIO
Cholesterol, Total: 189 mg/dL (ref 100–199)
HDL: 37 mg/dL — ABNORMAL LOW (ref >39)
LDL Chol Calc (NIH): 139 mg/dL — ABNORMAL HIGH (ref 0–99)
LDL/HDL Ratio: 3.8 ratio — ABNORMAL HIGH (ref 0.0–3.6)
Triglycerides: 71 mg/dL (ref 0–149)
VLDL Cholesterol Cal: 13 mg/dL (ref 5–40)

## 2023-11-14 LAB — CMP14+EGFR
ALT: 20 IU/L (ref 0–44)
AST: 16 IU/L (ref 0–40)
Albumin: 4.7 g/dL (ref 4.1–5.1)
Alkaline Phosphatase: 60 IU/L (ref 44–121)
BUN/Creatinine Ratio: 17 (ref 9–20)
BUN: 12 mg/dL (ref 6–20)
Bilirubin Total: 0.7 mg/dL (ref 0.0–1.2)
CO2: 22 mmol/L (ref 20–29)
Calcium: 9.9 mg/dL (ref 8.7–10.2)
Chloride: 100 mmol/L (ref 96–106)
Creatinine, Ser: 0.7 mg/dL — ABNORMAL LOW (ref 0.76–1.27)
Globulin, Total: 2.6 g/dL (ref 1.5–4.5)
Glucose: 81 mg/dL (ref 70–99)
Potassium: 4.6 mmol/L (ref 3.5–5.2)
Sodium: 139 mmol/L (ref 134–144)
Total Protein: 7.3 g/dL (ref 6.0–8.5)
eGFR: 125 mL/min/{1.73_m2} (ref >59)

## 2023-11-17 ENCOUNTER — Encounter: Payer: Self-pay | Admitting: Family Medicine

## 2023-11-20 ENCOUNTER — Ambulatory Visit: Payer: No Typology Code available for payment source | Admitting: Podiatry

## 2023-11-20 ENCOUNTER — Other Ambulatory Visit (HOSPITAL_COMMUNITY): Payer: Self-pay

## 2023-11-20 ENCOUNTER — Encounter: Payer: Self-pay | Admitting: Podiatry

## 2023-11-20 DIAGNOSIS — L02611 Cutaneous abscess of right foot: Secondary | ICD-10-CM

## 2023-11-20 DIAGNOSIS — R52 Pain, unspecified: Secondary | ICD-10-CM

## 2023-11-20 MED ORDER — CEPHALEXIN 500 MG PO CAPS
500.0000 mg | ORAL_CAPSULE | Freq: Four times a day (QID) | ORAL | 0 refills | Status: AC
  Filled 2023-11-20: qty 28, 7d supply, fill #0

## 2023-11-20 NOTE — Patient Instructions (Signed)

## 2023-11-20 NOTE — Progress Notes (Signed)
 Subjective: Chief Complaint  Patient presents with   Nail Problem    RM#11 Patient presents with pain to right big toe previous nail removal back in 06/2023 possible infection started hurting on Tuesday of this week.   33 year old male presents the office the above concerns, which is new.  She is interested in the right big toenail removed however earlier this week he started having increased pain to the area.  He states his wife is a nurse about the toe needs to be drained. No injury or recent treatment.   Objective: AAO x3, NAD DP/PT pulses palpable bilaterally, CRT less than 3 seconds On the medial aspect the right hallux appears to be a superficial abscess with localized edema and erythema.  There is no ascending cellulitis.  There is no crepitation noted.  Tenderness palpation tracking on the area.  No open lesions otherwise. No pain with calf compression, swelling, warmth, erythema  Assessment: Abscess right hallux nail bed  Plan: -All treatment options discussed with the patient including all alternatives, risks, complications.  -I discussed with him incision and drainage, debridement and exploration to see if there is any nail starting to grow back.  He agrees and consent was signed after discussing risks of the procedure.  Cleaned the skin with alcohol.  3 cc of lidocaine, Marcaine plain was infiltrated digital block fashion.  Once anesthetized I prepped her with Betadine and a tourniquet applied.  An incision was made along the area of the abscess and purulent drainage was identified and cultured.  I was not able to identify any residual nail or any new nail growth present.  There is no probing to bone.  No tunneling or undermining.  I irrigated.  Antibiotic ointment was applied followed by dressing.  Tourniquet released and there is found to be a good cap refill time to all the toes.  Tolerated well. -Prescribed Keflex. -Patient encouraged to call the office with any questions,  concerns, change in symptoms.   Vivi Barrack DPM

## 2023-11-24 ENCOUNTER — Encounter: Payer: Self-pay | Admitting: Podiatry

## 2023-11-24 LAB — WOUND CULTURE
MICRO NUMBER:: 16143964
SPECIMEN QUALITY:: ADEQUATE

## 2023-12-07 ENCOUNTER — Other Ambulatory Visit (HOSPITAL_COMMUNITY): Payer: Self-pay

## 2023-12-07 MED ORDER — ZEPBOUND 15 MG/0.5ML ~~LOC~~ SOAJ
15.0000 mg | SUBCUTANEOUS | 0 refills | Status: DC
Start: 2023-12-07 — End: 2023-12-31
  Filled 2023-12-07: qty 2, 28d supply, fill #0

## 2023-12-08 ENCOUNTER — Ambulatory Visit: Payer: No Typology Code available for payment source | Admitting: Podiatry

## 2023-12-31 ENCOUNTER — Other Ambulatory Visit (HOSPITAL_COMMUNITY): Payer: Self-pay

## 2023-12-31 MED ORDER — ZEPBOUND 15 MG/0.5ML ~~LOC~~ SOAJ
15.0000 mg | SUBCUTANEOUS | 0 refills | Status: DC
  Filled 2023-12-31: qty 2, 28d supply, fill #0

## 2024-01-25 ENCOUNTER — Other Ambulatory Visit (HOSPITAL_COMMUNITY): Payer: Self-pay

## 2024-01-25 MED ORDER — AMOXICILLIN 500 MG PO CAPS
ORAL_CAPSULE | ORAL | 0 refills | Status: AC
  Filled 2024-01-25: qty 25, 8d supply, fill #0

## 2024-02-01 ENCOUNTER — Other Ambulatory Visit (HOSPITAL_COMMUNITY): Payer: Self-pay

## 2024-02-01 MED ORDER — ZEPBOUND 15 MG/0.5ML ~~LOC~~ SOAJ
15.0000 mg | SUBCUTANEOUS | 0 refills | Status: DC
Start: 2024-02-01 — End: 2024-03-03
  Filled 2024-02-01: qty 2, 28d supply, fill #0

## 2024-03-03 ENCOUNTER — Other Ambulatory Visit (HOSPITAL_COMMUNITY): Payer: Self-pay

## 2024-03-03 MED ORDER — ZEPBOUND 15 MG/0.5ML ~~LOC~~ SOAJ
15.0000 mg | SUBCUTANEOUS | 0 refills | Status: AC
  Filled 2024-03-03: qty 2, 28d supply, fill #0

## 2024-03-24 ENCOUNTER — Other Ambulatory Visit (HOSPITAL_COMMUNITY): Payer: Self-pay

## 2024-03-24 MED ORDER — WEGOVY 2.4 MG/0.75ML ~~LOC~~ SOAJ
2.4000 mg | SUBCUTANEOUS | 0 refills | Status: DC
  Filled 2024-03-24: qty 3, 28d supply, fill #0

## 2024-04-06 ENCOUNTER — Telehealth: Payer: Self-pay

## 2024-04-06 NOTE — Telephone Encounter (Signed)
 Pharmacy Patient Advocate Encounter   Received notification from CoverMyMeds that prior authorization for Zepbound  12.5MG /0.5ML pen-injectors is due for renewal.   Insurance verification completed.   The patient is insured through [facility].  Action: Patient hasn't been seen recently in your office. Plan requires updated chart notes for PA renewal.  Key: [facility]

## 2024-04-21 ENCOUNTER — Other Ambulatory Visit (HOSPITAL_COMMUNITY): Payer: Self-pay

## 2024-04-21 MED ORDER — WEGOVY 2.4 MG/0.75ML ~~LOC~~ SOAJ
2.4000 mg | SUBCUTANEOUS | 0 refills | Status: DC
  Filled 2024-04-21: qty 3, 28d supply, fill #0

## 2024-05-13 ENCOUNTER — Other Ambulatory Visit (HOSPITAL_COMMUNITY): Payer: Self-pay

## 2024-05-13 MED ORDER — WEGOVY 2.4 MG/0.75ML ~~LOC~~ SOAJ
2.4000 mg | SUBCUTANEOUS | 0 refills | Status: DC
  Filled 2024-05-13: qty 3, 28d supply, fill #0

## 2024-06-10 ENCOUNTER — Telehealth: Admitting: Family Medicine

## 2024-06-10 ENCOUNTER — Ambulatory Visit: Payer: Self-pay

## 2024-06-10 ENCOUNTER — Other Ambulatory Visit (HOSPITAL_COMMUNITY): Payer: Self-pay

## 2024-06-10 DIAGNOSIS — B029 Zoster without complications: Secondary | ICD-10-CM

## 2024-06-10 MED ORDER — VALACYCLOVIR HCL 1 G PO TABS
1000.0000 mg | ORAL_TABLET | Freq: Three times a day (TID) | ORAL | 0 refills | Status: AC
  Filled 2024-06-10: qty 21, 7d supply, fill #0

## 2024-06-10 NOTE — Patient Instructions (Signed)
 Shingles  Shingles, or herpes zoster, is an infection. It gives you a skin rash and blisters. These infected areas may hurt a lot. Shingles only happens if: You've had chickenpox. You've been given a shot called a vaccine to protect you from getting chickenpox. Shingles is rare in this case. What are the causes? Shingles is caused by a germ called the varicella-zoster virus. This is the same germ that causes chickenpox. After you're exposed to the germ, it stays in your body but is dormant. This means it isn't active. Shingles happens if the germ becomes active again. This can happen years after you're first exposed to the germ. What increases the risk? You may be more likely to get shingles if: You're older than 34 years of age. You're under a lot of stress. You have a weak immune system. The immune system is your body's defense system. It may be weak if: You have human immunodeficiency virus (HIV). You have acquired immunodeficiency syndrome (AIDS). You have cancer. You take medicines that weaken your immune system. These include organ transplant medicines. What are the signs or symptoms? The first symptoms of shingles may be itching, tingling, or pain. Your skin may feel like it's burning. A few days or weeks later, you'll get a rash. Here's what you can expect: The rash is likely to be on one side of your body. The rash may be shaped like a belt or a band. Over time, it will turn into blisters filled with fluid. The blisters will break open and change into scabs. The scabs will dry up in about 2-3 weeks. You may also have: A fever. Chills. A headache. Nausea. How is this diagnosed? Shingles is diagnosed with a skin exam. A sample called a culture may be taken from one of your blisters and sent to a lab. This will show if you have shingles. How is this treated? The rash may last for several weeks. There's no cure for shingles, but your health care provider may give you medicines.  These medicines may: Help with pain. Help with itching. Help with irritation and swelling. Help you get better sooner. Help to prevent long-term problems. If the rash is on your face, you may need to see an eye doctor or an ear, nose, and throat (ENT) doctor. Follow these instructions at home: Medicines Take your medicines only as told by your provider. Put an anti-itch cream or numbing cream on the rash or blisters as told by your provider. Relieving itching and discomfort  To help with itching: Put cold, wet cloths called cold compresses on the rash or blisters. Take a cool bath. Try adding baking soda or dry oatmeal to the water. Do not bathe in hot water. Use calamine lotion on the rash or blisters. You can get this type of lotion at the store. Blister and rash care Keep your rash covered with a loose bandage. Wear loose clothes that don't rub on your rash. Take care of your rash as told by your provider. Make sure you: Wash your hands with soap and water for at least 20 seconds before and after you change your bandage. If you can't use soap and water, use hand sanitizer. Keep your rash and blisters clean by washing them with mild soap and cool water. Change your bandage. Check your rash every day for signs of infection. Check for: More redness, swelling, or pain. Fluid or blood. Warmth. Pus or a bad smell. Do not scratch your rash. Do not pick at your  blisters. To help you not scratch: Keep your fingernails clean and cut short. Try to wear gloves or mittens when you sleep. General instructions Rest. Wash your hands often with soap and water for at least 20 seconds. If you can't use soap and water, use hand sanitizer. Washing your hands lowers your chance of getting a skin infection. Your infection can cause chickenpox in others. If you have blisters that aren't scabs yet, stay away from: Babies. Pregnant people. Children who have eczema. Older people who have organ  transplants. People who have a long-term, or chronic, illness. Anyone who hasn't had chickenpox before. Anyone who hasn't gotten the chickenpox vaccine. How is this prevented? Vaccines are the best way to prevent you from getting chickenpox or shingles. Talk with your provider about getting these shots. Where to find more information Centers for Disease Control and Prevention (CDC): [employer].no Contact a health care provider if: Your pain doesn't get better with medicine. Your pain doesn't get better after the rash heals. You have any signs of infection around the rash. Your rash or blisters get worse. You have a fever or chills. Get help right away if: The rash is on your face or nose. You have pain in your face or by your eye. You lose feeling on one side of your face. You have trouble seeing. You have ear pain or ringing in your ear. This information is not intended to replace advice given to you by your health care provider. Make sure you discuss any questions you have with your health care provider. Document Revised: 06/11/2023 Document Reviewed: 10/24/2022 Elsevier Patient Education  2024 [employer].

## 2024-06-10 NOTE — Progress Notes (Signed)
 Virtual Visit Consent   Thorne Wirz Rose, you are scheduled for a virtual visit with a [facility] provider today. Just as with appointments in the office, your consent must be obtained to participate. Your consent will be active for this visit and any virtual visit you may have with one of our providers in the next 365 days. If you have a MyChart account, a copy of this consent can be sent to you electronically.  As this is a virtual visit, video technology does not allow for your provider to perform a traditional examination. This may limit your provider's ability to fully assess your condition. If your provider identifies any concerns that need to be evaluated in person or the need to arrange testing (such as labs, EKG, etc.), we will make arrangements to do so. Although advances in technology are sophisticated, we cannot ensure that it will always work on either your end or our end. If the connection with a video visit is poor, the visit may have to be switched to a telephone visit. With either a video or telephone visit, we are not always able to ensure that we have a secure connection.  By engaging in this virtual visit, you consent to the provision of healthcare and authorize for your insurance to be billed (if applicable) for the services provided during this visit. Depending on your insurance coverage, you may receive a charge related to this service.  I need to obtain your verbal consent now. Are you willing to proceed with your visit today? Dylan Rose has provided verbal consent on 06/10/2024 for a virtual visit (video or telephone). Loa Lamp, FNP  Date: 06/10/2024 5:38 PM   Virtual Visit via Video Note   I, Loa Lamp, connected with  Dylan Rose  ([id number], 04/13/1991) on 06/10/24 at  5:45 PM EDT by a video-enabled telemedicine application and verified that I am speaking with the correct person using two identifiers.  Location: Patient: Virtual Visit  Location Patient: Home Provider: Virtual Visit Location Provider: Home Office   I discussed the limitations of evaluation and management by telemedicine and the availability of in person appointments. The patient expressed understanding and agreed to proceed.    History of Present Illness: Dylan Rose is a 33 y.o. who identifies as a male who was assigned male at birth, and is being seen today for a rash on left arm up to axilla in a dermatomal pattern in patches, vesicular. Pain for a week, rash just started today. Denies chest pain and unusual stressors or illness.   HPI: HPI  Problems:  Patient Active Problem List   Diagnosis Date Noted   Abscess of skin 10/27/2023   Obesity 11/10/2022   Well adult exam 07/29/2021   Elevated blood pressure reading in office without diagnosis of hypertension 06/27/2021   Closed fracture of left proximal humerus 06/30/2017   Humeral head fracture, left, closed, initial encounter 06/30/2017    Allergies: No Known Allergies Medications:  Current Outpatient Medications:    valACYclovir  (VALTREX ) 1000 MG tablet, Take 1 tablet (1,000 mg total) by mouth 3 (three) times daily for 7 days., Disp: 21 tablet, Rfl: 0   amoxicillin  (AMOXIL ) 500 MG capsule, TAKE 1 CAPSULE EVERY 8 HOURS UNTIL GONE., Disp: 25 capsule, Rfl: 0   cephALEXin  (KEFLEX ) 500 MG capsule, Take 1 capsule (500 mg total) by mouth 4 (four) times daily., Disp: 28 capsule, Rfl: 0   scopolamine  (TRANSDERM-SCOP) 1 MG/3DAYS, Place 1 patch (1.5  mg total) onto the skin every 3 (three) days. (Patient not taking: Reported on 11/20/2023), Disp: 4 patch, Rfl: 0   semaglutide -weight management (WEGOVY ) 2.4 MG/0.75ML SOAJ SQ injection, Inject 2.4 mg into the skin once a week., Disp: 3 mL, Rfl: 0   tirzepatide  (ZEPBOUND ) 15 MG/0.5ML Pen, Inject 15 mg into the skin once a week., Disp: 2 mL, Rfl: 0  Observations/Objective: Patient is well-developed, well-nourished in no acute distress.  Resting  comfortably  at home.  Head is normocephalic, atraumatic.  No labored breathing.  Speech is clear and coherent with logical content.  Patient is alert and oriented at baseline.    Assessment and Plan: 1. Herpes zoster without complication (Primary)  Keep clean and dry UC as needed.   Follow Up Instructions: I discussed the assessment and treatment plan with the patient. The patient was provided an opportunity to ask questions and all were answered. The patient agreed with the plan and demonstrated an understanding of the instructions.  A copy of instructions were sent to the patient via MyChart unless otherwise noted below.     The patient was advised to call back or seek an in-person evaluation if the symptoms worsen or if the condition fails to improve as anticipated.    Dylan Payeur, FNP

## 2024-06-10 NOTE — Telephone Encounter (Signed)
 FYI Only or Action Required?: FYI only for provider.  Patient was last seen in primary care on 11/13/2023 by Alvia Bring, DO.  Called Nurse Triage reporting Arm Pain.  Symptoms began a week ago.  Symptoms are: gradually worsening.  Triage Disposition: See Physician Within 24 Hours  Patient/caregiver understands and will follow disposition?: Yes       Copied from CRM #[id number]. Topic: Clinical - Red Word Triage >> Jun 10, 2024  2:56 PM Dylan Rose wrote: Red Word that prompted transfer to Nurse Triage: Patient is experiencing some red bumps on his arm. The left arm started hurting about a week ago. If something bumps in or rubs against it it hurts. Its starting to spread down his arm. His wife thinks its shingles.       Reason for Disposition  [1] Painful rash AND [2] multiple small blisters grouped together (i.e., dermatomal distribution or band or stripe)  Answer Assessment - Initial Assessment Questions 1. ONSET: When did the pain start?     1 week ago 2. LOCATION: Where is the pain located?     Left arm  3. PAIN: How bad is the pain? (Scale 0-10; or none, mild, moderate, severe)     4/10 but worse when touched 4. WORK OR EXERCISE: Has there been any recent work or exercise that involved this part of the body?     No 5. CAUSE: What do you think is causing the arm pain?     Unsure, believes it may be shingles  6. OTHER SYMPTOMS: Do you have any other symptoms? (e.g., neck pain, swelling, rash, fever, numbness, weakness)     Rash now developing on the left arm  Protocols used: Arm Pain-A-AH

## 2024-06-14 ENCOUNTER — Ambulatory Visit: Admitting: Sports Medicine

## 2024-06-21 ENCOUNTER — Other Ambulatory Visit (HOSPITAL_COMMUNITY): Payer: Self-pay

## 2024-06-21 MED ORDER — WEGOVY 2.4 MG/0.75ML ~~LOC~~ SOAJ
2.4000 mg | SUBCUTANEOUS | 0 refills | Status: DC
  Filled 2024-06-21: qty 3, 28d supply, fill #0

## 2024-07-21 ENCOUNTER — Other Ambulatory Visit (HOSPITAL_COMMUNITY): Payer: Self-pay

## 2024-07-21 MED ORDER — WEGOVY 2.4 MG/0.75ML ~~LOC~~ SOAJ
2.4000 mg | SUBCUTANEOUS | 0 refills | Status: DC
  Filled 2024-07-21: qty 3, 28d supply, fill #0

## 2024-07-23 ENCOUNTER — Other Ambulatory Visit (HOSPITAL_COMMUNITY): Payer: Self-pay

## 2024-08-17 ENCOUNTER — Other Ambulatory Visit (HOSPITAL_COMMUNITY): Payer: Self-pay

## 2024-08-17 MED ORDER — WEGOVY 2.4 MG/0.75ML ~~LOC~~ SOAJ
2.4000 mg | SUBCUTANEOUS | 0 refills | Status: DC
  Filled 2024-08-17: qty 3, 28d supply, fill #0

## 2024-09-19 ENCOUNTER — Other Ambulatory Visit (HOSPITAL_COMMUNITY): Payer: Self-pay

## 2024-09-19 ENCOUNTER — Other Ambulatory Visit: Payer: Self-pay

## 2024-09-19 MED ORDER — WEGOVY 2.4 MG/0.75ML ~~LOC~~ SOAJ
2.4000 mg | SUBCUTANEOUS | 0 refills | Status: DC
  Filled 2024-09-19: qty 3, 28d supply, fill #0

## 2024-09-30 ENCOUNTER — Other Ambulatory Visit (HOSPITAL_COMMUNITY): Payer: Self-pay

## 2024-09-30 MED ORDER — FLUZONE 0.5 ML IM SUSY
0.5000 mL | PREFILLED_SYRINGE | Freq: Once | INTRAMUSCULAR | 0 refills | Status: AC
  Filled 2024-09-30: qty 0.5, 1d supply, fill #0

## 2024-10-13 ENCOUNTER — Other Ambulatory Visit (HOSPITAL_COMMUNITY): Payer: Self-pay

## 2024-10-13 MED ORDER — WEGOVY 2.4 MG/0.75ML ~~LOC~~ SOAJ
2.4000 mg | SUBCUTANEOUS | 0 refills | Status: AC
  Filled 2024-10-13: qty 3, 28d supply, fill #0
